# Patient Record
Sex: Male | Born: 1970
Health system: Southern US, Community
[De-identification: ages and names within clinical notes are randomized; demographics above are authoritative.]

## PROBLEM LIST (undated history)

## (undated) ENCOUNTER — Emergency Department (HOSPITAL_COMMUNITY): Payer: 59

## (undated) DIAGNOSIS — J45909 Unspecified asthma, uncomplicated: Secondary | ICD-10-CM

## (undated) DIAGNOSIS — F32A Depression, unspecified: Secondary | ICD-10-CM

## (undated) DIAGNOSIS — M1990 Unspecified osteoarthritis, unspecified site: Secondary | ICD-10-CM

## (undated) DIAGNOSIS — F329 Major depressive disorder, single episode, unspecified: Secondary | ICD-10-CM

## (undated) DIAGNOSIS — Z8619 Personal history of other infectious and parasitic diseases: Secondary | ICD-10-CM

## (undated) DIAGNOSIS — T7840XA Allergy, unspecified, initial encounter: Secondary | ICD-10-CM

## (undated) DIAGNOSIS — E119 Type 2 diabetes mellitus without complications: Secondary | ICD-10-CM

## (undated) HISTORY — DX: Unspecified osteoarthritis, unspecified site: M19.90

## (undated) HISTORY — DX: Major depressive disorder, single episode, unspecified: F32.9

## (undated) HISTORY — DX: Personal history of other infectious and parasitic diseases: Z86.19

## (undated) HISTORY — DX: Allergy, unspecified, initial encounter: T78.40XA

## (undated) HISTORY — DX: Type 2 diabetes mellitus without complications: E11.9

## (undated) HISTORY — DX: Unspecified asthma, uncomplicated: J45.909

## (undated) HISTORY — DX: Depression, unspecified: F32.A

---

## 2011-02-21 ENCOUNTER — Emergency Department (HOSPITAL_COMMUNITY)
Admission: EM | Admit: 2011-02-21 | Discharge: 2011-02-21 | Disposition: A | Discharge status: Home / Self Care | Payer: Self-pay | Attending: Emergency Medicine | Admitting: Emergency Medicine

## 2011-02-21 DIAGNOSIS — F17200 Nicotine dependence, unspecified, uncomplicated: Secondary | ICD-10-CM | POA: Insufficient documentation

## 2011-02-21 DIAGNOSIS — R0989 Other specified symptoms and signs involving the circulatory and respiratory systems: Secondary | ICD-10-CM | POA: Insufficient documentation

## 2011-02-21 DIAGNOSIS — R0609 Other forms of dyspnea: Secondary | ICD-10-CM | POA: Insufficient documentation

## 2015-08-09 ENCOUNTER — Ambulatory Visit (INDEPENDENT_AMBULATORY_CARE_PROVIDER_SITE_OTHER): Payer: 59 | Admitting: Internal Medicine

## 2015-08-09 ENCOUNTER — Encounter (INDEPENDENT_AMBULATORY_CARE_PROVIDER_SITE_OTHER): Payer: Self-pay

## 2015-08-09 ENCOUNTER — Encounter: Payer: Self-pay | Admitting: Internal Medicine

## 2015-08-09 VITALS — BP 110/80 | HR 66 | Temp 98.6°F | Resp 18 | Ht 72.0 in | Wt 260.4 lb

## 2015-08-09 DIAGNOSIS — Z23 Encounter for immunization: Secondary | ICD-10-CM

## 2015-08-09 DIAGNOSIS — Z125 Encounter for screening for malignant neoplasm of prostate: Secondary | ICD-10-CM | POA: Diagnosis not present

## 2015-08-09 DIAGNOSIS — M25521 Pain in right elbow: Secondary | ICD-10-CM | POA: Diagnosis not present

## 2015-08-09 DIAGNOSIS — M25512 Pain in left shoulder: Secondary | ICD-10-CM

## 2015-08-09 DIAGNOSIS — Z9109 Other allergy status, other than to drugs and biological substances: Secondary | ICD-10-CM

## 2015-08-09 DIAGNOSIS — E119 Type 2 diabetes mellitus without complications: Secondary | ICD-10-CM | POA: Diagnosis not present

## 2015-08-09 DIAGNOSIS — Z13220 Encounter for screening for lipoid disorders: Secondary | ICD-10-CM | POA: Diagnosis not present

## 2015-08-09 DIAGNOSIS — Z91048 Other nonmedicinal substance allergy status: Secondary | ICD-10-CM

## 2015-08-09 LAB — BASIC METABOLIC PANEL
BUN: 12 mg/dL (ref 7–25)
CHLORIDE: 103 mmol/L (ref 98–110)
CO2: 29 mmol/L (ref 20–31)
CREATININE: 1.16 mg/dL (ref 0.60–1.35)
Calcium: 9.4 mg/dL (ref 8.6–10.3)
Glucose, Bld: 118 mg/dL — ABNORMAL HIGH (ref 65–99)
Potassium: 3.9 mmol/L (ref 3.5–5.3)
Sodium: 140 mmol/L (ref 135–146)

## 2015-08-09 LAB — CBC WITH DIFFERENTIAL/PLATELET
BASOS ABS: 0 10*3/uL (ref 0.0–0.1)
BASOS PCT: 0 % (ref 0–1)
EOS ABS: 0.2 10*3/uL (ref 0.0–0.7)
EOS PCT: 3 % (ref 0–5)
HCT: 40.6 % (ref 39.0–52.0)
Hemoglobin: 14 g/dL (ref 13.0–17.0)
Lymphocytes Relative: 31 % (ref 12–46)
Lymphs Abs: 2.3 10*3/uL (ref 0.7–4.0)
MCH: 30.5 pg (ref 26.0–34.0)
MCHC: 34.5 g/dL (ref 30.0–36.0)
MCV: 88.5 fL (ref 78.0–100.0)
MPV: 10.4 fL (ref 8.6–12.4)
Monocytes Absolute: 0.5 10*3/uL (ref 0.1–1.0)
Monocytes Relative: 7 % (ref 3–12)
NEUTROS PCT: 59 % (ref 43–77)
Neutro Abs: 4.3 10*3/uL (ref 1.7–7.7)
PLATELETS: 186 10*3/uL (ref 150–400)
RBC: 4.59 MIL/uL (ref 4.22–5.81)
RDW: 13 % (ref 11.5–15.5)
WBC: 7.3 10*3/uL (ref 4.0–10.5)

## 2015-08-09 LAB — LIPID PANEL
CHOL/HDL RATIO: 4.4 ratio (ref <=5.0)
Cholesterol: 188 mg/dL (ref 125–200)
HDL: 43 mg/dL (ref >=40)
LDL Cholesterol: 117 mg/dL (ref <130)
Triglycerides: 139 mg/dL (ref <150)
VLDL: 28 mg/dL (ref <30)

## 2015-08-09 LAB — HEMOGLOBIN A1C
HEMOGLOBIN A1C: 7.8 % — AB (ref <5.7)
Mean Plasma Glucose: 177 mg/dL — ABNORMAL HIGH (ref <117)

## 2015-08-09 LAB — TSH: TSH: 0.887 u[IU]/mL (ref 0.350–4.500)

## 2015-08-09 LAB — HEPATIC FUNCTION PANEL
ALT: 19 U/L (ref 9–46)
AST: 14 U/L (ref 10–40)
Albumin: 4.4 g/dL (ref 3.6–5.1)
Alkaline Phosphatase: 65 U/L (ref 40–115)
BILIRUBIN DIRECT: 0.1 mg/dL (ref <=0.2)
BILIRUBIN TOTAL: 0.9 mg/dL (ref 0.2–1.2)
Indirect Bilirubin: 0.8 mg/dL (ref 0.2–1.2)
Total Protein: 7.2 g/dL (ref 6.1–8.1)

## 2015-08-09 NOTE — Patient Instructions (Signed)

## 2015-08-09 NOTE — Progress Notes (Signed)
Pre-visit discussion using our clinic review tool. No additional management support is needed unless otherwise documented below in the visit note.  

## 2015-08-09 NOTE — Progress Notes (Signed)
Patient ID: Edward Spencer, male   DOB: 09-03-1971, 44 y.o.   MRN: [id number]   Subjective:    Patient ID: Edward Spencer, male    DOB: 04-02-1971, 44 y.o.   MRN: [id number]  HPI  Patient here to establish care.  Has a history of diabetes and reports a history of asthma, arthritis, depression, allergies.  States was diagnosed with diabetes one year ago.  Was started on metformin.  Did not tolerate secondary to GI issues.  States sugars previously had been averaging 130-150.  Now sugars are increased to 200.  Discussed diet and exercise.  He has not seen a nutritionist.  Does report some right elbow pain.  States that has been present for a few months.  Takes alleve.  States was lifting and heard something pop.  Persistent pain.  Desires referral to ortho.  Has had problems with left shoulder pain for the last 8-10 years.  Sore when pressure is applied.  Intermittent flares.  Stable.  No nausea or vomiting.  No cardiac symptoms with increased activity or exertion.  No sob.  No urinary issues.  Bowels stable.     Past Medical History  Diagnosis Date  . Asthma   . Arthritis   . Depression   . Diabetes mellitus without complication   . History of chicken pox   . Allergy    No past surgical history on file. Family History  Problem Relation Age of Onset  . Arthritis Mother   . Hyperlipidemia Mother   . Arthritis Father   . Hypertension Father   . Diabetes Father   . Mental illness Maternal Aunt   . Hyperlipidemia Maternal Aunt   . Alcohol abuse Maternal Aunt   . Mental illness Paternal Aunt   . Alcohol abuse Paternal Aunt   . Arthritis Paternal Uncle   . Arthritis Maternal Grandmother   . Arthritis Paternal Grandmother   . Hyperlipidemia Paternal Grandmother   . Diabetes Paternal Grandmother    Social History   Social History  . Marital Status: Single    Spouse Name: N/A  . Number of Children: N/A  . Years of Education: N/A   Social History Main Topics  . Smoking status:  Current Every Day Smoker  . Smokeless tobacco: Never Used  . Alcohol Use: 0.0 oz/week    0 Standard drinks or equivalent per week  . Drug Use: No  . Sexual Activity: Not Asked   Other Topics Concern  . None   Social History Narrative    No outpatient encounter prescriptions on file as of 08/09/2015.   No facility-administered encounter medications on file as of 08/09/2015.    Review of Systems  Constitutional: Negative for fever and appetite change.  HENT: Negative for congestion and sinus pressure.   Eyes: Negative for pain and visual disturbance.  Respiratory: Negative for cough, chest tightness and shortness of breath.   Cardiovascular: Negative for chest pain, palpitations and leg swelling.  Gastrointestinal: Negative for nausea, vomiting, abdominal pain and diarrhea.  Genitourinary: Negative for dysuria and difficulty urinating.  Musculoskeletal: Negative for back pain and joint swelling.       Left shoulder pain as outlined.  Increased right elbow pain.    Skin: Negative for color change and rash.  Neurological: Negative for dizziness, light-headedness and headaches.  Hematological: Negative for adenopathy. Does not bruise/bleed easily.  Psychiatric/Behavioral: Negative for dysphoric mood and agitation.       Objective:    Physical Exam  Constitutional:  He appears well-developed and well-nourished. No distress.  HENT:  Nose: Nose normal.  Mouth/Throat: Oropharynx is clear and moist.  Eyes: Conjunctivae are normal. Right eye exhibits no discharge. Left eye exhibits no discharge.  Neck: Neck supple. No thyromegaly present.  Cardiovascular: Normal rate and regular rhythm.   Pulmonary/Chest: Effort normal and breath sounds normal. No respiratory distress.  Abdominal: Soft. Bowel sounds are normal. There is no tenderness.  Musculoskeletal: He exhibits no edema or tenderness.  Lymphadenopathy:    He has no cervical adenopathy.  Skin: No rash noted. No erythema.    Psychiatric: He has a normal mood and affect. His behavior is normal.    BP 110/80 mmHg  Pulse 66  Temp(Src) 98.6 F (37 C) (Oral)  Resp 18  Ht 6' (1.829 m)  Wt 260 lb 6 oz (118.105 kg)  BMI 35.31 kg/m2  SpO2 96% Wt Readings from Last 3 Encounters:  08/09/15 260 lb 6 oz (118.105 kg)       Assessment & Plan:   Problem List Items Addressed This Visit    Diabetes mellitus - Primary    Low carb diet and exercise.  On no medication now.  Discussed the possibility of trying extended release metformin.  May have less GI side effects.  Will check met b and a1c.  States sees eye doctor regularly.  Will refer to a nutritionist.        Relevant Orders   CBC with Differential/Platelet (Completed)   TSH (Completed)   Hepatic function panel (Completed)   Hemoglobin A1c (Completed)   Microalbumin / creatinine urine ratio (Completed)   Basic metabolic panel (Completed)   Ambulatory referral to Nutrition and Diabetic Education   Environmental allergies    History of allergies.  No problems reported.        Left shoulder pain    Persistent intermittent pain.  States has been present for years.  Desires no further intervention.  Follow.       Right elbow pain    States heard something "pop" when started.  Persistent pain.  Taking alleve.  Helps.  Continued pain.  Avoid positions that put his elbow in a strained position.  Have ortho evaluate.        Relevant Orders   Ambulatory referral to Orthopedic Surgery    Other Visit Diagnoses    Screening cholesterol level        Relevant Orders    Lipid panel (Completed)    Prostate cancer screening        Relevant Orders    PSA (Completed)        Einar Pheasant, MD

## 2015-08-10 ENCOUNTER — Encounter: Payer: Self-pay | Admitting: Internal Medicine

## 2015-08-10 LAB — MICROALBUMIN / CREATININE URINE RATIO
CREATININE, URINE: 278 mg/dL
MICROALB UR: 0.9 mg/dL (ref <2.0)
Microalb Creat Ratio: 3.2 mg/g (ref 0.0–30.0)

## 2015-08-10 LAB — PSA: PSA: 0.45 ng/mL (ref <=4.00)

## 2015-08-10 MED ORDER — METFORMIN HCL ER 500 MG PO TB24: 500.0000 mg | ORAL_TABLET | Freq: Every day | ORAL | Status: DC

## 2015-08-10 NOTE — Telephone Encounter (Signed)
rx sent in for metformin xr  q day #30 with 2 refills.  Pt notified via my chart.

## 2015-08-11 ENCOUNTER — Encounter: Payer: Self-pay | Admitting: Internal Medicine

## 2015-08-11 DIAGNOSIS — M25512 Pain in left shoulder: Secondary | ICD-10-CM | POA: Insufficient documentation

## 2015-08-11 DIAGNOSIS — Z9109 Other allergy status, other than to drugs and biological substances: Secondary | ICD-10-CM | POA: Insufficient documentation

## 2015-08-11 DIAGNOSIS — M25521 Pain in right elbow: Secondary | ICD-10-CM | POA: Insufficient documentation

## 2015-08-11 NOTE — Assessment & Plan Note (Signed)
States heard something "pop" when started.  Persistent pain.  Taking alleve.  Helps.  Continued pain.  Avoid positions that put his elbow in a strained position.  Have ortho evaluate.

## 2015-08-11 NOTE — Assessment & Plan Note (Signed)
History of allergies.  No problems reported.

## 2015-08-11 NOTE — Assessment & Plan Note (Signed)
Persistent intermittent pain.  States has been present for years.  Desires no further intervention.  Follow.

## 2015-08-11 NOTE — Assessment & Plan Note (Addendum)
Low carb diet and exercise.  On no medication now.  Discussed the possibility of trying extended release metformin.  May have less GI side effects.  Will check met b and a1c.  States sees eye doctor regularly.  Will refer to a nutritionist.

## 2015-08-12 ENCOUNTER — Encounter: Payer: Self-pay | Admitting: Internal Medicine

## 2015-08-19 DIAGNOSIS — M7710 Lateral epicondylitis, unspecified elbow: Secondary | ICD-10-CM | POA: Insufficient documentation

## 2015-09-24 ENCOUNTER — Ambulatory Visit (INDEPENDENT_AMBULATORY_CARE_PROVIDER_SITE_OTHER): Payer: 59 | Admitting: Internal Medicine

## 2015-09-24 ENCOUNTER — Encounter: Payer: Self-pay | Admitting: Internal Medicine

## 2015-09-24 VITALS — BP 122/80 | HR 66 | Temp 98.7°F | Resp 18 | Ht 72.0 in | Wt 257.4 lb

## 2015-09-24 DIAGNOSIS — M25512 Pain in left shoulder: Secondary | ICD-10-CM | POA: Diagnosis not present

## 2015-09-24 DIAGNOSIS — E119 Type 2 diabetes mellitus without complications: Secondary | ICD-10-CM | POA: Diagnosis not present

## 2015-09-24 DIAGNOSIS — Z91048 Other nonmedicinal substance allergy status: Secondary | ICD-10-CM

## 2015-09-24 DIAGNOSIS — Z0000 Encounter for general adult medical examination without abnormal findings: Secondary | ICD-10-CM

## 2015-09-24 DIAGNOSIS — Z9109 Other allergy status, other than to drugs and biological substances: Secondary | ICD-10-CM

## 2015-09-24 DIAGNOSIS — M25521 Pain in right elbow: Secondary | ICD-10-CM

## 2015-09-24 NOTE — Progress Notes (Signed)
Patient ID: Edward Spencer, male   DOB: 06/27/1971, 44 y.o.   MRN: [id number]   Subjective:    Patient ID: Edward Spencer, male    DOB: 16-Mar-1971, 44 y.o.   MRN: [id number]  HPI  Patient with past history of diabetes and documented asthma who comes in today to follow up on these issues as well as for a complete physical exam.  He declined physical today.  States his sugar is averaging 140-190.  AM sugars averaging 150.  He is tolerating the extended release metformin.  Discussed diet and exercise.  He checks his sugars about 30 minutes after eating.  Discussed importance of eating regular meals.  No cardiac symptoms with increased activity or exertion.  No sob.  No increased cough or congestion.  No acid reflux.  No abdominal pain or cramping.  Bowels stable.  Discussed referral to lifestyles.     Past Medical History  Diagnosis Date  . Asthma   . Arthritis   . Depression   . Diabetes mellitus without complication ([place])   . History of chicken pox   . Allergy    No past surgical history on file. Family History  Problem Relation Age of Onset  . Arthritis Mother   . Hyperlipidemia Mother   . Arthritis Father   . Hypertension Father   . Diabetes Father   . Mental illness Maternal Aunt   . Hyperlipidemia Maternal Aunt   . Alcohol abuse Maternal Aunt   . Mental illness Paternal Aunt   . Alcohol abuse Paternal Aunt   . Arthritis Paternal Uncle   . Arthritis Maternal Grandmother   . Arthritis Paternal Grandmother   . Hyperlipidemia Paternal Grandmother   . Diabetes Paternal Grandmother    Social History   Social History  . Marital Status: Single    Spouse Name: N/A  . Number of Children: N/A  . Years of Education: N/A   Social History Main Topics  . Smoking status: Current Every Day Smoker  . Smokeless tobacco: Never Used  . Alcohol Use: 0.0 oz/week    0 Standard drinks or equivalent per week  . Drug Use: No  . Sexual Activity: Not Asked   Other Topics Concern  . None    Social History Narrative    Outpatient Encounter Prescriptions as of 09/24/2015  Medication Sig  . meloxicam (MOBIC) 15 MG tablet Take 15 mg by mouth daily.  . metFORMIN (GLUCOPHAGE XR) 500 MG 24 hr tablet Take 1 tablet (500 mg total) by mouth daily with breakfast.   No facility-administered encounter medications on file as of 09/24/2015.    Review of Systems  Constitutional: Negative for appetite change and unexpected weight change.  HENT: Negative for congestion and sinus pressure.   Eyes: Negative for pain and visual disturbance.  Respiratory: Negative for cough, chest tightness and shortness of breath.   Cardiovascular: Negative for chest pain, palpitations and leg swelling.  Gastrointestinal: Negative for nausea, vomiting, abdominal pain and diarrhea.  Genitourinary: Negative for dysuria and difficulty urinating.  Musculoskeletal: Negative for back pain and joint swelling.  Skin: Negative for color change and rash.  Neurological: Negative for dizziness, light-headedness and headaches.  Hematological: Negative for adenopathy. Does not bruise/bleed easily.  Psychiatric/Behavioral: Negative for dysphoric mood and agitation.       Objective:     Blood pressure rechecked by me:  118/82  Physical Exam  Constitutional: He is oriented to person, place, and time. He appears well-developed and well-nourished. No distress.  HENT:  Head: Normocephalic and atraumatic.  Nose: Nose normal.  Mouth/Throat: Oropharynx is clear and moist.  Eyes: Conjunctivae are normal. Right eye exhibits no discharge. Left eye exhibits no discharge.  Neck: Neck supple. No thyromegaly present.  Cardiovascular: Normal rate and regular rhythm.   Pulmonary/Chest: Breath sounds normal. No respiratory distress. He has no wheezes.  Abdominal: Soft. Bowel sounds are normal. There is no tenderness.  Genitourinary:  Pt declined.    Musculoskeletal: He exhibits no edema or tenderness.  Lymphadenopathy:    He  has no cervical adenopathy.  Neurological: He is alert and oriented to person, place, and time.  Skin: Skin is warm and dry. No rash noted. No erythema.  Psychiatric: He has a normal mood and affect. His behavior is normal.    BP 122/80 mmHg  Pulse 66  Temp(Src) 98.7 F (37.1 C) (Oral)  Resp 18  Ht 6' (1.829 m)  Wt 257 lb 6 oz (116.745 kg)  BMI 34.90 kg/m2  SpO2 97% Wt Readings from Last 3 Encounters:  09/24/15 257 lb 6 oz (116.745 kg)  08/09/15 260 lb 6 oz (118.105 kg)     Lab Results  Component Value Date   WBC 7.3 08/09/2015   HGB 14.0 08/09/2015   HCT 40.6 08/09/2015   PLT 186 08/09/2015   GLUCOSE 118* 08/09/2015   CHOL 188 08/09/2015   TRIG 139 08/09/2015   HDL 43 08/09/2015   LDLCALC 117 08/09/2015   ALT 19 08/09/2015   AST 14 08/09/2015   NA 140 08/09/2015   K 3.9 08/09/2015   CL 103 08/09/2015   CREATININE 1.16 08/09/2015   BUN 12 08/09/2015   CO2 29 08/09/2015   TSH 0.887 08/09/2015   PSA 0.45 08/09/2015   HGBA1C 7.8* 08/09/2015   MICROALBUR 0.9 08/09/2015       Assessment & Plan:   Problem List Items Addressed This Visit    Diabetes mellitus ([place]) - Primary    Tolerating the extended release metformin.  Discussed eating regular meals and diet adjustment.  Discussed checking sugars fasting and two hours after eating.  Continue same medication regimen.  Refer to lifestyles. Follow sugars.  Follow met b and a1c.        Relevant Orders   Lipid panel   Hepatic function panel   Hemoglobin W6O   Basic metabolic panel   Environmental allergies    History of allergies.  No problems reported today.  Follow.        Health care maintenance    Declined complete physical today.  PSA .45 08/09/15.  Follow cholesterol.        Left shoulder pain    Present for years.  Follow.       Right elbow pain    Was referred to ortho.            Einar Pheasant, MD

## 2015-09-24 NOTE — Progress Notes (Signed)
Pre-visit discussion using our clinic review tool. No additional management support is needed unless otherwise documented below in the visit note.  

## 2015-09-29 ENCOUNTER — Encounter: Payer: Self-pay | Admitting: Internal Medicine

## 2015-09-29 DIAGNOSIS — Z0000 Encounter for general adult medical examination without abnormal findings: Secondary | ICD-10-CM | POA: Insufficient documentation

## 2015-09-29 NOTE — Assessment & Plan Note (Signed)
Tolerating the extended release metformin.  Discussed eating regular meals and diet adjustment.  Discussed checking sugars fasting and two hours after eating.  Continue same medication regimen.  Refer to lifestyles. Follow sugars.  Follow met b and a1c.

## 2015-09-29 NOTE — Assessment & Plan Note (Signed)
Present for years.  Follow.

## 2015-09-29 NOTE — Assessment & Plan Note (Signed)
Declined complete physical today.  PSA .45 08/09/15.  Follow cholesterol.

## 2015-09-29 NOTE — Assessment & Plan Note (Signed)
History of allergies.  No problems reported today.  Follow.

## 2015-09-29 NOTE — Assessment & Plan Note (Signed)
Was referred to ortho.

## 2015-10-16 ENCOUNTER — Other Ambulatory Visit: Payer: Self-pay | Admitting: Internal Medicine

## 2015-11-07 ENCOUNTER — Other Ambulatory Visit: Payer: Self-pay | Admitting: Internal Medicine

## 2015-12-03 ENCOUNTER — Other Ambulatory Visit: Payer: 59

## 2015-12-06 ENCOUNTER — Ambulatory Visit: Payer: 59 | Admitting: Internal Medicine

## 2016-01-24 ENCOUNTER — Other Ambulatory Visit (INDEPENDENT_AMBULATORY_CARE_PROVIDER_SITE_OTHER): Payer: 59

## 2016-01-24 DIAGNOSIS — E119 Type 2 diabetes mellitus without complications: Secondary | ICD-10-CM | POA: Diagnosis not present

## 2016-01-24 LAB — BASIC METABOLIC PANEL
BUN: 19 mg/dL (ref 6–23)
CALCIUM: 9.5 mg/dL (ref 8.4–10.5)
CO2: 30 meq/L (ref 19–32)
Chloride: 105 mEq/L (ref 96–112)
Creatinine, Ser: 1.13 mg/dL (ref 0.40–1.50)
GFR: 74.25 mL/min (ref >60.00)
Glucose, Bld: 117 mg/dL — ABNORMAL HIGH (ref 70–99)
POTASSIUM: 4.5 meq/L (ref 3.5–5.1)
SODIUM: 140 meq/L (ref 135–145)

## 2016-01-24 LAB — LIPID PANEL
CHOL/HDL RATIO: 4
CHOLESTEROL: 147 mg/dL (ref 0–200)
HDL: 34.7 mg/dL — ABNORMAL LOW (ref >39.00)
LDL CALC: 88 mg/dL (ref 0–99)
NonHDL: 112.77
Triglycerides: 122 mg/dL (ref 0.0–149.0)
VLDL: 24.4 mg/dL (ref 0.0–40.0)

## 2016-01-24 LAB — HEPATIC FUNCTION PANEL
ALT: 17 U/L (ref 0–53)
AST: 16 U/L (ref 0–37)
Albumin: 4.6 g/dL (ref 3.5–5.2)
Alkaline Phosphatase: 50 U/L (ref 39–117)
BILIRUBIN TOTAL: 0.7 mg/dL (ref 0.2–1.2)
Bilirubin, Direct: 0.2 mg/dL (ref 0.0–0.3)
Total Protein: 7.5 g/dL (ref 6.0–8.3)

## 2016-01-24 LAB — HEMOGLOBIN A1C: HEMOGLOBIN A1C: 7.6 % — AB (ref 4.6–6.5)

## 2016-01-25 ENCOUNTER — Encounter: Payer: Self-pay | Admitting: Internal Medicine

## 2016-01-31 ENCOUNTER — Encounter: Payer: Self-pay | Admitting: Internal Medicine

## 2016-01-31 ENCOUNTER — Ambulatory Visit (INDEPENDENT_AMBULATORY_CARE_PROVIDER_SITE_OTHER): Payer: 59 | Admitting: Internal Medicine

## 2016-01-31 VITALS — BP 116/60 | HR 82 | Temp 97.4°F | Resp 12 | Ht 72.0 in | Wt 247.8 lb

## 2016-01-31 DIAGNOSIS — R51 Headache: Secondary | ICD-10-CM

## 2016-01-31 DIAGNOSIS — R519 Headache, unspecified: Secondary | ICD-10-CM

## 2016-01-31 DIAGNOSIS — E119 Type 2 diabetes mellitus without complications: Secondary | ICD-10-CM | POA: Diagnosis not present

## 2016-01-31 DIAGNOSIS — M25521 Pain in right elbow: Secondary | ICD-10-CM | POA: Diagnosis not present

## 2016-01-31 LAB — SEDIMENTATION RATE: Sed Rate: 10 mm/hr (ref 0–22)

## 2016-01-31 NOTE — Progress Notes (Signed)
Patient ID: GOERGE Spencer, male   DOB: [birth year], 45 y.o.   MRN: [id number]   Subjective:    Patient ID: Edward Spencer, male    DOB: 12-Sep-1971, 45 y.o.   MRN: [id number]  HPI  Patient here for a scheduled follow up.  Has diabetes.  On metformin XR.  Over the last few weeks, he has really adjusted his diet.  Lost weight.  Very strict with his diet.  Virtually no carbs.  States sugars now 104 - 117 in the am and pm sugars 80-120.  Does not eat a bedtime snack.  No chest pain or tightness.  Stays active.  No sob.  No acid reflux.  No abdominal pain or cramping.  Bowels stable.  Reports persistent headache for the last 10 days.  Does not usually have headaches.  States usually starts around 9:00- lunch.  Then lasts all day.  States eating lunch may help some, but the headache does not resolve until goes to bed.  Does not wake up with headache.  Takes alleve.  Describes the headache - involves frontal pressure - around and behind eyes.  No congestion.  Some eye strain.  No dizziness or light headedness.      Past Medical History  Diagnosis Date  . Asthma   . Arthritis   . Depression   . Diabetes mellitus without complication ([place])   . History of chicken pox   . Allergy    No past surgical history on file. Family History  Problem Relation Age of Onset  . Arthritis Mother   . Hyperlipidemia Mother   . Arthritis Father   . Hypertension Father   . Diabetes Father   . Mental illness Maternal Aunt   . Hyperlipidemia Maternal Aunt   . Alcohol abuse Maternal Aunt   . Mental illness Paternal Aunt   . Alcohol abuse Paternal Aunt   . Arthritis Paternal Uncle   . Arthritis Maternal Grandmother   . Arthritis Paternal Grandmother   . Hyperlipidemia Paternal Grandmother   . Diabetes Paternal Grandmother    Social History   Social History  . Marital Status: Single    Spouse Name: N/A  . Number of Children: N/A  . Years of Education: N/A   Social History Main Topics  . Smoking status:  Current Every Day Smoker  . Smokeless tobacco: Never Used  . Alcohol Use: 0.0 oz/week    0 Standard drinks or equivalent per week  . Drug Use: No  . Sexual Activity: Not Asked   Other Topics Concern  . None   Social History Narrative    Outpatient Encounter Prescriptions as of 01/31/2016  Medication Sig  . metFORMIN (GLUCOPHAGE-XR) 500 MG 24 hr tablet TAKE 1 TABLET (500 MG TOTAL) BY MOUTH DAILY WITH BREAKFAST.  . [DISCONTINUED] meloxicam (MOBIC) 15 MG tablet Take 15 mg by mouth daily.  . [DISCONTINUED] metFORMIN (GLUCOPHAGE-XR) 500 MG 24 hr tablet TAKE 1 TABLET (500 MG TOTAL) BY MOUTH DAILY WITH BREAKFAST.   No facility-administered encounter medications on file as of 01/31/2016.    Review of Systems  Constitutional:       Has adjusted his diet.  Lost weight.  Low carbs.    HENT: Negative for congestion and sinus pressure.   Eyes: Negative for pain and discharge.  Respiratory: Negative for cough, chest tightness and shortness of breath.   Cardiovascular: Negative for chest pain, palpitations and leg swelling.  Gastrointestinal: Negative for nausea, vomiting, abdominal pain and diarrhea.  Genitourinary:  Negative for dysuria and difficulty urinating.  Musculoskeletal: Negative for back pain and joint swelling.  Skin: Negative for color change and rash.  Neurological: Positive for headaches. Negative for dizziness and light-headedness.  Psychiatric/Behavioral: Negative for dysphoric mood and agitation.       Objective:    Physical Exam  Constitutional: He appears well-developed and well-nourished. No distress.  HENT:  Nose: Nose normal.  Mouth/Throat: Oropharynx is clear and moist.  TMs without erythema.   Eyes: Conjunctivae are normal. Right eye exhibits no discharge. Left eye exhibits no discharge.  Neck: Neck supple. No thyromegaly present.  Cardiovascular: Normal rate and regular rhythm.   Pulmonary/Chest: Effort normal and breath sounds normal. No respiratory  distress.  Abdominal: Soft. Bowel sounds are normal. There is no tenderness.  Musculoskeletal: He exhibits no edema or tenderness.  Lymphadenopathy:    He has no cervical adenopathy.  Skin: No rash noted. No erythema.  Psychiatric: He has a normal mood and affect. His behavior is normal.    BP 116/60 mmHg  Pulse 82  Temp(Src) 97.4 F (36.3 C) (Oral)  Resp 12  Ht 6' (1.829 m)  Wt 247 lb 12.8 oz (112.401 kg)  BMI 33.60 kg/m2  SpO2 97% Wt Readings from Last 3 Encounters:  01/31/16 247 lb 12.8 oz (112.401 kg)  09/24/15 257 lb 6 oz (116.745 kg)  08/09/15 260 lb 6 oz (118.105 kg)     Lab Results  Component Value Date   WBC 7.3 08/09/2015   HGB 14.0 08/09/2015   HCT 40.6 08/09/2015   PLT 186 08/09/2015   GLUCOSE 117* 01/24/2016   CHOL 147 01/24/2016   TRIG 122.0 01/24/2016   HDL 34.70* 01/24/2016   LDLCALC 88 01/24/2016   ALT 17 01/24/2016   AST 16 01/24/2016   NA 140 01/24/2016   K 4.5 01/24/2016   CL 105 01/24/2016   CREATININE 1.13 01/24/2016   BUN 19 01/24/2016   CO2 30 01/24/2016   TSH 0.887 08/09/2015   PSA 0.45 08/09/2015   HGBA1C 7.6* 01/24/2016   MICROALBUR 0.9 08/09/2015       Assessment & Plan:   Problem List Items Addressed This Visit    Diabetes mellitus ([place])    A1c recently checked 7.6.  Decreased slightly.  He has adjusted his diet significantly in the last few weeks.  Sugars as outlined.  Hold on increasing medication.  Discussed the need to eat regular meals and snacks and needs to eat a bedtime snack.  Concern may be a little too strict with diet, contributing to headaches.  Discussed liberalizing diet some.  Follow sugars.  Get him back in soon to reassess.        Headache - Primary    Persistent headache over the last 10 days.  Unclear etiology.  No sinus symptoms.  Sleeping well.  Some eye strain.  Schedule appt for eye exam. Liberalize diet as discussed.  Check ESR.  No neck issues.  Discussed further w/up if persistent (including scanning,  etc).  Will try above measures first.  Follow closely.  Pt in agreement.        Relevant Orders   Sedimentation rate (Completed)   Right elbow pain    Saw ortho.  Took mobic.  Better.  Follow.          I spent 25 minutes with the patient and more than 50% of the time was spent in consultation regarding the above.     Einar Pheasant, MD

## 2016-01-31 NOTE — Progress Notes (Signed)
Pre visit review using our clinic review tool, if applicable. No additional management support is needed unless otherwise documented below in the visit note. 

## 2016-02-01 ENCOUNTER — Encounter: Payer: Self-pay | Admitting: Internal Medicine

## 2016-02-02 ENCOUNTER — Encounter: Payer: Self-pay | Admitting: Internal Medicine

## 2016-02-02 DIAGNOSIS — R519 Headache, unspecified: Secondary | ICD-10-CM | POA: Insufficient documentation

## 2016-02-02 DIAGNOSIS — R51 Headache: Secondary | ICD-10-CM

## 2016-02-02 NOTE — Assessment & Plan Note (Signed)
Saw ortho.  Took mobic.  Better.  Follow.

## 2016-02-02 NOTE — Assessment & Plan Note (Signed)
Persistent headache over the last 10 days.  Unclear etiology.  No sinus symptoms.  Sleeping well.  Some eye strain.  Schedule appt for eye exam. Liberalize diet as discussed.  Check ESR.  No neck issues.  Discussed further w/up if persistent (including scanning, etc).  Will try above measures first.  Follow closely.  Pt in agreement.

## 2016-02-02 NOTE — Assessment & Plan Note (Signed)
A1c recently checked 7.6.  Decreased slightly.  He has adjusted his diet significantly in the last few weeks.  Sugars as outlined.  Hold on increasing medication.  Discussed the need to eat regular meals and snacks and needs to eat a bedtime snack.  Concern may be a little too strict with diet, contributing to headaches.  Discussed liberalizing diet some.  Follow sugars.  Get him back in soon to reassess.

## 2016-02-05 NOTE — Telephone Encounter (Signed)
Unread mychart message mailed to patient 

## 2016-02-21 ENCOUNTER — Ambulatory Visit (INDEPENDENT_AMBULATORY_CARE_PROVIDER_SITE_OTHER): Payer: 59 | Admitting: Internal Medicine

## 2016-02-21 ENCOUNTER — Encounter: Payer: Self-pay | Admitting: Internal Medicine

## 2016-02-21 VITALS — BP 110/62 | HR 68 | Temp 98.6°F | Resp 18 | Ht 72.0 in | Wt 246.2 lb

## 2016-02-21 DIAGNOSIS — R51 Headache: Secondary | ICD-10-CM | POA: Diagnosis not present

## 2016-02-21 DIAGNOSIS — Z91048 Other nonmedicinal substance allergy status: Secondary | ICD-10-CM | POA: Diagnosis not present

## 2016-02-21 DIAGNOSIS — Z9109 Other allergy status, other than to drugs and biological substances: Secondary | ICD-10-CM

## 2016-02-21 DIAGNOSIS — R519 Headache, unspecified: Secondary | ICD-10-CM

## 2016-02-21 DIAGNOSIS — E119 Type 2 diabetes mellitus without complications: Secondary | ICD-10-CM

## 2016-02-21 MED ORDER — METFORMIN HCL ER 500 MG PO TB24: ORAL_TABLET | ORAL | Status: DC

## 2016-02-21 NOTE — Progress Notes (Signed)
Pre-visit discussion using our clinic review tool. No additional management support is needed unless otherwise documented below in the visit note.  

## 2016-02-21 NOTE — Progress Notes (Signed)
Patient ID: Edward Spencer, male   DOB: 1971/04/25, 45 y.o.   MRN: [id number]   Subjective:    Patient ID: Edward Spencer, male    DOB: Mar 08, 1971, 45 y.o.   MRN: [id number]  HPI  Patient here for a scheduled follow up.  He feels his headaches were coming from allergies and sinus.  No headache now.  Has liberalized his diet some.  Still monitoring carbs.  He is watching his diet.  Has lost weight.  No chest pain or tightness.  No sob.  No acid reflux.  No abdominal pain or cramping.  Bowels stable.  States blood sugar in am and pm 120s.  Just after eat - 150-160.  Feels good.     Past Medical History  Diagnosis Date  . Asthma   . Arthritis   . Depression   . Diabetes mellitus without complication ([place])   . History of chicken pox   . Allergy    No past surgical history on file. Family History  Problem Relation Age of Onset  . Arthritis Mother   . Hyperlipidemia Mother   . Arthritis Father   . Hypertension Father   . Diabetes Father   . Mental illness Maternal Aunt   . Hyperlipidemia Maternal Aunt   . Alcohol abuse Maternal Aunt   . Mental illness Paternal Aunt   . Alcohol abuse Paternal Aunt   . Arthritis Paternal Uncle   . Arthritis Maternal Grandmother   . Arthritis Paternal Grandmother   . Hyperlipidemia Paternal Grandmother   . Diabetes Paternal Grandmother    Social History   Social History  . Marital Status: Single    Spouse Name: N/A  . Number of Children: N/A  . Years of Education: N/A   Social History Main Topics  . Smoking status: Current Every Day Smoker  . Smokeless tobacco: Never Used  . Alcohol Use: 0.0 oz/week    0 Standard drinks or equivalent per week  . Drug Use: No  . Sexual Activity: Not Asked   Other Topics Concern  . None   Social History Narrative    Outpatient Encounter Prescriptions as of 02/21/2016  Medication Sig  . metFORMIN (GLUCOPHAGE-XR) 500 MG 24 hr tablet TAKE 1 TABLET (500 MG TOTAL) BY MOUTH DAILY WITH BREAKFAST.  .  [DISCONTINUED] metFORMIN (GLUCOPHAGE-XR) 500 MG 24 hr tablet TAKE 1 TABLET (500 MG TOTAL) BY MOUTH DAILY WITH BREAKFAST.  Edward Kitchen phentermine (ADIPEX-P) 37.5 MG tablet Reported on 02/21/2016   No facility-administered encounter medications on file as of 02/21/2016.    Review of Systems  Constitutional: Negative for unexpected weight change.       Has adjusted diet.  Has lost weight.   HENT: Negative for congestion and sinus pressure.   Respiratory: Negative for cough, chest tightness and shortness of breath.   Cardiovascular: Negative for chest pain, palpitations and leg swelling.  Gastrointestinal: Negative for nausea, vomiting, abdominal pain and diarrhea.  Skin: Negative for color change and rash.  Neurological: Negative for dizziness, light-headedness and headaches.  Psychiatric/Behavioral: Negative for dysphoric mood and agitation.       Objective:    Physical Exam  Constitutional: He appears well-developed and well-nourished. No distress.  HENT:  Nose: Nose normal.  Mouth/Throat: Oropharynx is clear and moist.  Neck: Neck supple. No thyromegaly present.  Cardiovascular: Normal rate and regular rhythm.   Pulmonary/Chest: Effort normal and breath sounds normal. No respiratory distress.  Abdominal: Soft. Bowel sounds are normal. There is no tenderness.  Musculoskeletal: He exhibits no edema or tenderness.  Lymphadenopathy:    He has no cervical adenopathy.  Skin: No rash noted. No erythema.  Psychiatric: He has a normal mood and affect. His behavior is normal.    BP 110/62 mmHg  Pulse 68  Temp(Src) 98.6 F (37 C) (Oral)  Resp 18  Ht 6' (1.829 m)  Wt 246 lb 4 oz (111.698 kg)  BMI 33.39 kg/m2  SpO2 95% Wt Readings from Last 3 Encounters:  02/21/16 246 lb 4 oz (111.698 kg)  01/31/16 247 lb 12.8 oz (112.401 kg)  09/24/15 257 lb 6 oz (116.745 kg)     Lab Results  Component Value Date   WBC 7.3 08/09/2015   HGB 14.0 08/09/2015   HCT 40.6 08/09/2015   PLT 186 08/09/2015    GLUCOSE 117* 01/24/2016   CHOL 147 01/24/2016   TRIG 122.0 01/24/2016   HDL 34.70* 01/24/2016   LDLCALC 88 01/24/2016   ALT 17 01/24/2016   AST 16 01/24/2016   NA 140 01/24/2016   K 4.5 01/24/2016   CL 105 01/24/2016   CREATININE 1.13 01/24/2016   BUN 19 01/24/2016   CO2 30 01/24/2016   TSH 0.887 08/09/2015   PSA 0.45 08/09/2015   HGBA1C 7.6* 01/24/2016   MICROALBUR 0.9 08/09/2015       Assessment & Plan:   Problem List Items Addressed This Visit    Diabetes mellitus ([place]) - Primary    Sugars as outlined.  Tolerating metformin XR.  Watching diet.  Follow met b and a1c.       Relevant Medications   metFORMIN (GLUCOPHAGE-XR) 500 MG 24 hr tablet   Other Relevant Orders   Lipid panel   Hepatic function panel   Hemoglobin K3E   Basic metabolic panel   Environmental allergies    Controlled.        Headache    Better.  Resolved.  Felt related to allergies/sinus.  Follow.           Einar Pheasant, MD

## 2016-02-23 ENCOUNTER — Encounter: Payer: Self-pay | Admitting: Internal Medicine

## 2016-02-23 NOTE — Assessment & Plan Note (Signed)
Better.  Resolved.  Felt related to allergies/sinus.  Follow.

## 2016-02-23 NOTE — Assessment & Plan Note (Signed)
Controlled.  

## 2016-02-23 NOTE — Assessment & Plan Note (Signed)
Sugars as outlined.  Tolerating metformin XR.  Watching diet.  Follow met b and a1c.

## 2016-04-26 ENCOUNTER — Encounter: Payer: Self-pay | Admitting: Internal Medicine

## 2016-04-27 ENCOUNTER — Encounter: Payer: Self-pay | Admitting: Emergency Medicine

## 2016-04-27 ENCOUNTER — Emergency Department: Payer: Managed Care, Other (non HMO)

## 2016-04-27 ENCOUNTER — Ambulatory Visit
Admission: EM | Admit: 2016-04-27 | Discharge: 2016-04-27 | Disposition: A | Discharge status: Home / Self Care | Payer: Managed Care, Other (non HMO) | Attending: Family Medicine | Admitting: Family Medicine

## 2016-04-27 DIAGNOSIS — E119 Type 2 diabetes mellitus without complications: Secondary | ICD-10-CM | POA: Insufficient documentation

## 2016-04-27 DIAGNOSIS — R519 Headache, unspecified: Secondary | ICD-10-CM

## 2016-04-27 DIAGNOSIS — M1990 Unspecified osteoarthritis, unspecified site: Secondary | ICD-10-CM | POA: Diagnosis not present

## 2016-04-27 DIAGNOSIS — F17210 Nicotine dependence, cigarettes, uncomplicated: Secondary | ICD-10-CM | POA: Diagnosis not present

## 2016-04-27 DIAGNOSIS — J45909 Unspecified asthma, uncomplicated: Secondary | ICD-10-CM | POA: Insufficient documentation

## 2016-04-27 DIAGNOSIS — F329 Major depressive disorder, single episode, unspecified: Secondary | ICD-10-CM | POA: Diagnosis not present

## 2016-04-27 DIAGNOSIS — R51 Headache: Secondary | ICD-10-CM | POA: Diagnosis not present

## 2016-04-27 DIAGNOSIS — T700XXA Otitic barotrauma, initial encounter: Secondary | ICD-10-CM | POA: Insufficient documentation

## 2016-04-27 NOTE — ED Notes (Addendum)
Pt presents to ed c/o severe headache since Friday with dizziness. Was sent here by [facility] for [facility]

## 2016-04-27 NOTE — ED Provider Notes (Signed)
CSN: [id number]     Arrival date & time 04/27/16  1733 History   First MD Initiated Contact with Patient 04/27/16 1841    Nurses notes were reviewed. Chief Complaint  Patient presents with  . Headache    Pt reports frontal headache behind eyes, nausea, and weakness since Friday. Seen at [facility] on Saturday and diagnosed with sinus infection and placed on Augmentin without improvement. Change in positions make nausea worse. Pain 10/10   Patient is reporting a frontal headache. This headache started on Friday with generalized weakness. [facility] and diagnosed with a sinus infection and placed on Augmentin. He and his significant other reports that he doesn't really have postnasal drainage and the Augmentin has not helped him at all. Change in position makes him worse he states the pain is a 10 out of 10 and reports pain is in the back of his eyes and his eyes just throbbing. He's never had headaches significantly before and this headache is definitely a 10 out of 10 and no history of migraine problem. There is no history of migraines headaches in the family.  He has a history of diabetes depression and asthma and arthritis and his brothers had arthritis and hyperlipidemia as follows had hypertension arthritis and diabetes. Unfortunately. He does smoke.    (Consider location/radiation/quality/duration/timing/severity/associated sxs/prior Treatment) Patient is a 45 y.o. male presenting with headaches. The history is provided by the patient and a significant other. No language interpreter was used.  Headache Pain location:  Frontal Quality:  Sharp Severity at highest:  10/10 Onset quality:  Sudden Duration:  4 days Timing:  Constant Progression:  Waxing and waning Chronicity:  New Similar to prior headaches: no   Context: activity   Relieved by:  Nothing Worsened by:  Nothing Ineffective treatments:  None tried (Augmentin has failed) Associated symptoms:  eye pain, facial pain, nausea and weakness   Risk factors: sedentary lifestyle     Past Medical History  Diagnosis Date  . Asthma   . Arthritis   . Depression   . Diabetes mellitus without complication ([facility])   . History of chicken pox   . Allergy    History reviewed. No pertinent past surgical history. Family History  Problem Relation Age of Onset  . Arthritis Mother   . Hyperlipidemia Mother   . Arthritis Father   . Hypertension Father   . Diabetes Father   . Mental illness Maternal Aunt   . Hyperlipidemia Maternal Aunt   . Alcohol abuse Maternal Aunt   . Mental illness Paternal Aunt   . Alcohol abuse Paternal Aunt   . Arthritis Paternal Uncle   . Arthritis Maternal Grandmother   . Arthritis Paternal Grandmother   . Hyperlipidemia Paternal Grandmother   . Diabetes Paternal Grandmother    Social History  Substance Use Topics  . Smoking status: Current Every Day Smoker    Types: Cigarettes  . Smokeless tobacco: Never Used  . Alcohol Use: 0.0 oz/week    0 Standard drinks or equivalent per week     Comment: social    Review of Systems  Eyes: Positive for pain.  Gastrointestinal: Positive for nausea.  Neurological: Positive for weakness and headaches.  All other systems reviewed and are negative.   Allergies  Review of patient's allergies indicates no known allergies.  Home Medications   Prior to Admission medications   Medication Sig Start Date End Date Taking? Authorizing Provider  metFORMIN (GLUCOPHAGE-XR) 500 MG  24 hr tablet TAKE 1 TABLET (500 MG TOTAL) BY MOUTH DAILY WITH BREAKFAST. 02/21/16   Dale Durhamharlene Scott, MD  phentermine (ADIPEX-P) 37.5 MG tablet Reported on 02/21/2016 02/10/16   Historical Provider, MD   Meds Ordered and Administered this Visit  Medications - No data to display  BP 120/72 mmHg  Pulse 56  Temp(Src) 98.6 F (37 C) (Oral)  Resp 20  Ht 6\' 1"  (1.854 m)  Wt 224 lb (101.606 kg)  BMI 29.56 kg/m2  SpO2 97% No data found.   Physical  Exam  Constitutional: He is oriented to person, place, and time. He appears well-developed and well-nourished.  HENT:  Head: Normocephalic and atraumatic.    Right Ear: Hearing, tympanic membrane, external ear and ear canal normal.  Left Ear: Hearing, tympanic membrane and external ear normal.  Nose: Nose normal.  Mouth/Throat: Uvula is midline and oropharynx is clear and moist. No oral lesions. Normal dentition. No dental caries. No posterior oropharyngeal edema or posterior oropharyngeal erythema.  He has some left TMJ tenderness but this pain is not consistent with the headache pain that he is having.  Eyes: Conjunctivae are normal. Pupils are equal, round, and reactive to light.  Neck: Normal range of motion. Neck supple.  Cardiovascular: Normal rate, regular rhythm and normal heart sounds.   Pulmonary/Chest: Effort normal.  Musculoskeletal: Normal range of motion. He exhibits no edema.  Lymphadenopathy:    He has no cervical adenopathy.  Neurological: He is alert and oriented to person, place, and time.  Skin: Skin is warm and dry.  Psychiatric: He has a normal mood and affect.  Vitals reviewed.   ED Course  Procedures (including critical care time)  Labs Review Labs Reviewed - No data to display  Imaging Review No results found.   Visual Acuity Review  Right Eye Distance:   Left Eye Distance:   Bilateral Distance:    Right Eye Near:   Left Eye Near:    Bilateral Near:         MDM   1. Acute intractable headache, unspecified headache type     Because of the intensity of t this is fair to headache and failure of one t regimen already I'm recom CT of the head. Since is no tach here today or this evening recommend he go to the ED of his choice which [facility]. Explained to him that I think that they will go ahead and do a CT scan of his head tonight. However I did not fill couple trying any type of treatment or modality until CT scan is done with his history  and the intensity of his headache.     Note: This dictation was prepared with Dragon dictation along with smaller phrase technology. Any transcriptional errors that result from this process are unintentional.   Hassan RowanEugene Benjimen Kelley, MD 04/27/16 1914

## 2016-04-27 NOTE — Discharge Instructions (Signed)

## 2016-04-28 ENCOUNTER — Emergency Department
Admission: EM | Admit: 2016-04-28 | Discharge: 2016-04-28 | Disposition: A | Discharge status: Home / Self Care | Payer: Managed Care, Other (non HMO) | Attending: Emergency Medicine | Admitting: Emergency Medicine

## 2016-04-28 DIAGNOSIS — R51 Headache: Secondary | ICD-10-CM

## 2016-04-28 DIAGNOSIS — R519 Headache, unspecified: Secondary | ICD-10-CM

## 2016-04-28 DIAGNOSIS — T700XXA Otitic barotrauma, initial encounter: Secondary | ICD-10-CM

## 2016-04-28 LAB — BASIC METABOLIC PANEL
ANION GAP: 7 (ref 5–15)
BUN: 14 mg/dL (ref 6–20)
CALCIUM: 9 mg/dL (ref 8.9–10.3)
CO2: 27 mmol/L (ref 22–32)
Chloride: 103 mmol/L (ref 101–111)
Creatinine, Ser: 0.92 mg/dL (ref 0.61–1.24)
Glucose, Bld: 97 mg/dL (ref 65–99)
Potassium: 4 mmol/L (ref 3.5–5.1)
SODIUM: 137 mmol/L (ref 135–145)

## 2016-04-28 LAB — SEDIMENTATION RATE: Sed Rate: 17 mm/hr — ABNORMAL HIGH (ref 0–15)

## 2016-04-28 LAB — CBC WITH DIFFERENTIAL/PLATELET
BASOS ABS: 0 10*3/uL (ref 0–0.1)
BASOS PCT: 1 %
Eosinophils Absolute: 0.2 10*3/uL (ref 0–0.7)
Eosinophils Relative: 3 %
HEMATOCRIT: 42.3 % (ref 40.0–52.0)
Hemoglobin: 14.8 g/dL (ref 13.0–18.0)
Lymphocytes Relative: 35 %
Lymphs Abs: 2.3 10*3/uL (ref 1.0–3.6)
MCH: 30.2 pg (ref 26.0–34.0)
MCHC: 34.9 g/dL (ref 32.0–36.0)
MCV: 86.5 fL (ref 80.0–100.0)
MONO ABS: 0.7 10*3/uL (ref 0.2–1.0)
Monocytes Relative: 11 %
NEUTROS ABS: 3.4 10*3/uL (ref 1.4–6.5)
Neutrophils Relative %: 52 %
PLATELETS: 165 10*3/uL (ref 150–440)
RBC: 4.89 MIL/uL (ref 4.40–5.90)
RDW: 12.6 % (ref 11.5–14.5)
WBC: 6.7 10*3/uL (ref 3.8–10.6)

## 2016-04-28 LAB — MONONUCLEOSIS SCREEN: MONO SCREEN: NEGATIVE

## 2016-04-28 LAB — CK: CK TOTAL: 50 U/L (ref 49–397)

## 2016-04-28 MED ORDER — PROCHLORPERAZINE EDISYLATE 5 MG/ML IJ SOLN
5.0000 mg | Freq: Once | INTRAMUSCULAR | Status: AC
Start: 2016-04-28 — End: 2016-04-28
  Administered 2016-04-28: 5 mg via INTRAVENOUS
  Filled 2016-04-28: qty 2

## 2016-04-28 MED ORDER — KETOROLAC TROMETHAMINE 30 MG/ML IJ SOLN: 10.0000 mg | Freq: Once | INTRAMUSCULAR | Status: DC

## 2016-04-28 MED ORDER — SODIUM CHLORIDE 0.9 % IV BOLUS (SEPSIS)
1000.0000 mL | Freq: Once | INTRAVENOUS | Status: AC
  Administered 2016-04-28: 1000 mL via INTRAVENOUS

## 2016-04-28 MED ORDER — METHYLPREDNISOLONE 4 MG PO TBPK: ORAL_TABLET | ORAL | Status: DC

## 2016-04-28 MED ORDER — PROCHLORPERAZINE EDISYLATE 5 MG/ML IJ SOLN: 5.0000 mg | Freq: Once | INTRAMUSCULAR | Status: DC

## 2016-04-28 MED ORDER — DEXAMETHASONE SODIUM PHOSPHATE 10 MG/ML IJ SOLN
INTRAMUSCULAR | Status: AC
  Administered 2016-04-28: 10 mg via INTRAVENOUS
  Filled 2016-04-28: qty 1

## 2016-04-28 MED ORDER — DEXAMETHASONE SODIUM PHOSPHATE 10 MG/ML IJ SOLN
10.0000 mg | Freq: Once | INTRAMUSCULAR | Status: AC
  Administered 2016-04-28: 10 mg via INTRAVENOUS

## 2016-04-28 MED ORDER — KETOROLAC TROMETHAMINE 30 MG/ML IJ SOLN
10.0000 mg | Freq: Once | INTRAMUSCULAR | Status: AC
  Administered 2016-04-28: 9.9 mg via INTRAVENOUS
  Filled 2016-04-28: qty 1

## 2016-04-28 MED ORDER — PROCHLORPERAZINE MALEATE 10 MG PO TABS: 10.0000 mg | ORAL_TABLET | Freq: Four times a day (QID) | ORAL | Status: DC | PRN

## 2016-04-28 MED ORDER — DIPHENHYDRAMINE HCL 50 MG/ML IJ SOLN
25.0000 mg | Freq: Once | INTRAMUSCULAR | Status: AC
  Administered 2016-04-28: 25 mg via INTRAVENOUS
  Filled 2016-04-28: qty 1

## 2016-04-28 NOTE — Discharge Instructions (Signed)
1. Take steroid as prescribed (Medrol Dosepak). 2. You may take Compazine (#20) as needed for headache. 3. Return to the ER for worsening symptoms, persistent vomiting, lethargy or other concerns.  General Headache Without Cause A headache is pain or discomfort felt around the head or neck area. The specific cause of a headache may not be found. There are many causes and types of headaches. A few common ones are:  Tension headaches.  Migraine headaches.  Cluster headaches.  Chronic daily headaches. HOME CARE INSTRUCTIONS  Watch your condition for any changes. Take these steps to help with your condition: Managing Pain  Take over-the-counter and prescription medicines only as told by your health care provider.  Lie down in a dark, quiet room when you have a headache.  If directed, apply ice to the head and neck area:  Put ice in a plastic bag.  Place a towel between your skin and the bag.  Leave the ice on for 20 minutes, 2-3 times per day.  Use a heating pad or hot shower to apply heat to the head and neck area as told by your health care provider.  Keep lights dim if bright lights bother you or make your headaches worse. Eating and Drinking  Eat meals on a regular schedule.  Limit alcohol use.  Decrease the amount of caffeine you drink, or stop drinking caffeine. General Instructions  Keep all follow-up visits as told by your health care provider. This is important.  Keep a headache journal to help find out what may trigger your headaches. For example, write down:  What you eat and drink.  How much sleep you get.  Any change to your diet or medicines.  Try massage or other relaxation techniques.  Limit stress.  Sit up straight, and do not tense your muscles.  Do not use tobacco products, including cigarettes, chewing tobacco, or e-cigarettes. If you need help quitting, ask your health care provider.  Exercise regularly as told by your health care  provider.  Sleep on a regular schedule. Get 7-9 hours of sleep, or the amount recommended by your health care provider. SEEK MEDICAL CARE IF:   Your symptoms are not helped by medicine.  You have a headache that is different from the usual headache.  You have nausea or you vomit.  You have a fever. SEEK IMMEDIATE MEDICAL CARE IF:   Your headache becomes severe.  You have repeated vomiting.  You have a stiff neck.  You have a loss of vision.  You have problems with speech.  You have pain in the eye or ear.  You have muscular weakness or loss of muscle control.  You lose your balance or have trouble walking.  You feel faint or pass out.  You have confusion.   This information is not intended to replace advice given to you by your health care provider. Make sure you discuss any questions you have with your health care provider.   Document Released: 11/02/2005 Document Revised: 07/24/2015 Document Reviewed: 02/25/2015 Elsevier Interactive Patient Education 2016 [employer]  [employer] Barotitis media is inflammation of your middle ear. This occurs when the auditory tube (eustachian tube) leading from the back of your nose (nasopharynx) to your eardrum is blocked. This blockage may result from a cold, environmental allergies, or an upper respiratory infection. Unresolved barotitis media may lead to damage or hearing loss (barotrauma), which may become permanent. HOME CARE INSTRUCTIONS   Use medicines as recommended by your health care provider. Over-the-counter  medicines will help unblock the canal and can help during times of air travel.  Do not put anything into your ears to clean or unplug them. Eardrops will not be helpful.  Do not swim, dive, or fly until your health care provider says it is all right to do so. If these activities are necessary, chewing gum with frequent, forceful swallowing may help. It is also helpful to hold your nose and gently blow to pop  your ears for equalizing pressure changes. This forces air into the eustachian tube.  Only take over-the-counter or prescription medicines for pain, discomfort, or fever as directed by your health care provider.  A decongestant may be helpful in decongesting the middle ear and make pressure equalization easier. SEEK MEDICAL CARE IF:  You experience a serious form of dizziness in which you feel as if the room is spinning and you feel nauseated (vertigo).  Your symptoms only involve one ear. SEEK IMMEDIATE MEDICAL CARE IF:   You develop a severe headache, dizziness, or severe ear pain.  You have bloody or pus-like drainage from your ears.  You develop a fever.  Your problems do not improve or become worse. MAKE SURE YOU:   Understand these instructions.  Will watch your condition.  Will get help right away if you are not doing well or get worse.   This information is not intended to replace advice given to you by your health care provider. Make sure you discuss any questions you have with your health care provider.   Document Released: 10/30/2000 Document Revised: 08/23/2013 Document Reviewed: 05/30/2013 Elsevier Interactive Patient Education [employer]

## 2016-04-28 NOTE — ED Provider Notes (Signed)
[facility] Emergency Department Provider Note   ____________________________________________  Time seen: Approximately 12:53 AM  I have reviewed the triage vital signs and the nursing notes.   HISTORY  Chief Complaint Headache    HPI Edward Spencer is a 45 y.o. male who presents to the ED from medical urgent care for CT headsecondary to headache. Patient has a history of diet controlled diabetes mellitus who complains of severe headache 4 days. 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 Complains of acute onset approximately noon on Friday associated with body aches and chills. [facility] who diagnosed him with sinusitis and placed him on Augmentin. Patient is halfway through his course of Augmentin and states he feels worse. He was seen at medical in urgent care yesterday afternoon and referred to the ED for head CT. Complains of frontal headache associated with frontal sinus pressure, body aches and occasional nausea. Denies associated fever, neck pain, vision changes, chest pain, shortness of breath, abdominal pain, dysuria, diarrhea. Denies recent travel or trauma. Denies recent tick bite. Has tried OTC Aleve without pain relief. States headache seems to be waxing/waning and worse in the evenings.   Past Medical History  Diagnosis Date  . Asthma   . Arthritis   . Depression   . Diabetes mellitus without complication ([place])   . History of chicken pox   . Allergy     Patient Active Problem List   Diagnosis Date Noted  . Headache 02/02/2016  . Health care maintenance 09/29/2015  . Right elbow pain 08/11/2015  . Left shoulder pain 08/11/2015  . Environmental allergies 08/11/2015  . Diabetes mellitus ([place]) 08/09/2015    History reviewed. No pertinent past surgical history.  Current Outpatient Rx  Name  Route  Sig  Dispense  Refill  . loratadine (CLARITIN) 10 MG tablet   Oral   Take 10 mg by mouth  daily.         . naproxen sodium (ANAPROX) 220 MG tablet   Oral   Take 220 mg by mouth daily.         . methylPREDNISolone (MEDROL DOSEPAK) 4 MG TBPK tablet      Take as directed   21 tablet   0   . prochlorperazine (COMPAZINE) 10 MG tablet   Oral   Take 1 tablet (10 mg total) by mouth every 6 (six) hours as needed for nausea.   20 tablet   0     Allergies Review of patient's allergies indicates no known allergies.  Family History  Problem Relation Age of Onset  . Arthritis Mother   . Hyperlipidemia Mother   . Arthritis Father   . Hypertension Father   . Diabetes Father   . Mental illness Maternal Aunt   . Hyperlipidemia Maternal Aunt   . Alcohol abuse Maternal Aunt   . Mental illness Paternal Aunt   . Alcohol abuse Paternal Aunt   . Arthritis Paternal Uncle   . Arthritis Maternal Grandmother   . Arthritis Paternal Grandmother   . Hyperlipidemia Paternal Grandmother   . Diabetes Paternal Grandmother     Social History Social History  Substance Use Topics  . Smoking status: Current Every Day Smoker    Types: Cigarettes  . Smokeless tobacco: Never Used  . Alcohol Use: 0.0 oz/week    0 Standard drinks or equivalent per week     Comment: social    Review of Systems  Constitutional: No  fever. Positive for chills. Eyes: No visual changes. ENT: No sore throat. Cardiovascular: Denies chest pain. Respiratory: Denies shortness of breath. Gastrointestinal: No abdominal pain.  Positive for occasional nausea, no vomiting.  No diarrhea.  No constipation. Genitourinary: Negative for dysuria. Musculoskeletal: Negative for back pain. Skin: Negative for rash. Neurological: Positive for headache. Negative for focal weakness or numbness.  10-point ROS otherwise negative.  ____________________________________________   PHYSICAL EXAM:  VITAL SIGNS: ED Triage Vitals  Enc Vitals Group     BP 04/27/16 2010 123/68 mmHg     Pulse Rate 04/27/16 2010 64     Resp  04/27/16 2010 18     Temp 04/27/16 2010 98.9 F (37.2 C)     Temp Source 04/27/16 2010 Oral     SpO2 04/27/16 2010 98 %     Weight 04/27/16 2010 224 lb 5 oz (101.747 kg)     Height 04/27/16 2010 _0  (1.854 m)     Head Cir --      Peak Flow --      Pain Score 04/28/16 0036 8     Pain Loc --      Pain Edu? --      Excl. in [place]? --     Constitutional: Alert and oriented. Well appearing and in no acute distress. Eyes: Conjunctivae are normal. PERRL. EOMI. Head: Atraumatic. Frontal sinus and temples tender to palpation. Ears: Bilateral TMs with moderate fluid, no erythema or rupture. Nose: No congestion/rhinnorhea. Mouth/Throat: Mucous membranes are moist.  Oropharynx non-erythematous. Neck: No stridor.  Supple neck without meningismus.  No carotid bruits. Cardiovascular: Normal rate, regular rhythm. Grossly normal heart sounds.  Good peripheral circulation. Respiratory: Normal respiratory effort.  No retractions. Lungs CTAB. Gastrointestinal: Soft and nontender. No distention. No abdominal bruits. No CVA tenderness. Musculoskeletal: No lower extremity tenderness nor edema.  No joint effusions. Neurologic:  Normal speech and language. No gross focal neurologic deficits are appreciated. No gait instability. Skin:  Skin is warm, dry and intact. No rash noted. Specifically, no petechiae.  Psychiatric: Mood and affect are normal. Speech and behavior are normal.  ____________________________________________   LABS (all labs ordered are listed, but only abnormal results are displayed)  Labs Reviewed  SEDIMENTATION RATE - Abnormal; Notable for the following:    Sed Rate 17 (*)    All other components within normal limits  CULTURE, BLOOD (ROUTINE X 2)  CULTURE, BLOOD (ROUTINE X 2)  CBC WITH DIFFERENTIAL/PLATELET  BASIC METABOLIC PANEL  CK  MONONUCLEOSIS SCREEN    ____________________________________________  EKG  None ____________________________________________  RADIOLOGY  CT head without contrast interpreted per Dr. Golden Circle: No acute intracranial abnormality is noted. ____________________________________________   PROCEDURES  Procedure(s) performed: None  Critical Care performed: No  ____________________________________________   INITIAL IMPRESSION / ASSESSMENT AND PLAN / ED COURSE  Pertinent labs & imaging results that were available during my care of the patient were reviewed by me and considered in my medical decision making (see chart for details).  45 year old male who presents with a 4 day history of frontal headache. He is well appearing with supple neck without focal neurological deficits. CT head negative for acute intracranial abnormalities. Will obtain screening lab work including CK, ESR. Will try IV fluids and headache cocktail.  ----------------------------------------- 2:57 AM on 04/28/2016 -----------------------------------------  Patient improved after headache cocktail. Neck remains supple without focal neurological deficits. Updated patient and spouse of laboratory results. Plan for Medrol Dosepak, Compazine to take as needed for headache and neurology follow-up. Strict  return precautions given. Both verbalize understanding and agree with plan of care.  ____________________________________________   FINAL CLINICAL IMPRESSION(S) / ED DIAGNOSES  Final diagnoses:  Headache, unspecified headache type  Barotitis media, initial encounter      NEW MEDICATIONS STARTED DURING THIS VISIT:  New Prescriptions   METHYLPREDNISOLONE (MEDROL DOSEPAK) 4 MG TBPK TABLET    Take as directed   PROCHLORPERAZINE (COMPAZINE) 10 MG TABLET    Take 1 tablet (10 mg total) by mouth every 6 (six) hours as needed for nausea.     Note:  This document was prepared using Dragon voice recognition software and may include  unintentional dictation errors.    Paulette Blanch, MD 04/28/16 [phone]

## 2016-04-28 NOTE — ED Notes (Signed)
Discharge instructions reviewed with patient. Patient verbalized understanding. Patient ambulated to lobby without difficulty.   

## 2016-05-03 LAB — CULTURE, BLOOD (ROUTINE X 2)
Culture: NO GROWTH
Culture: NO GROWTH

## 2016-05-15 ENCOUNTER — Other Ambulatory Visit: Payer: 59

## 2016-05-22 ENCOUNTER — Ambulatory Visit: Payer: 59 | Admitting: Internal Medicine

## 2018-12-22 ENCOUNTER — Ambulatory Visit: Payer: BLUE CROSS/BLUE SHIELD | Admitting: Family Medicine

## 2018-12-22 ENCOUNTER — Encounter: Payer: Self-pay | Admitting: Family Medicine

## 2018-12-22 VITALS — BP 112/64 | HR 72 | Temp 98.8°F | Resp 16 | Ht 73.0 in | Wt 255.6 lb

## 2018-12-22 DIAGNOSIS — E119 Type 2 diabetes mellitus without complications: Secondary | ICD-10-CM | POA: Diagnosis not present

## 2018-12-22 LAB — POCT GLYCOSYLATED HEMOGLOBIN (HGB A1C)
HBA1C, POC (CONTROLLED DIABETIC RANGE): 8.8 % — AB (ref 0.0–7.0)
HEMOGLOBIN A1C: 8.8 % (ref 4.0–5.6)
HEMOGLOBIN A1C: 8.8 % — AB (ref 4.0–5.6)
HbA1c, POC (prediabetic range): 8.8 % — AB (ref 5.7–6.4)

## 2018-12-22 MED ORDER — EMPAGLIFLOZIN 25 MG PO TABS
25.0000 mg | ORAL_TABLET | Freq: Every day | ORAL | 1 refills | Status: DC
Start: 2018-12-22 — End: 2019-07-10

## 2018-12-22 NOTE — Progress Notes (Signed)
Subjective:    Patient ID: Edward Spencer, male    DOB: 09/09/1971, 48 y.o.   MRN: [id number]  HPI   Patient presents to clinic for discussion of his blood sugars.  He was last seen here in 2017.  States he was taking metformin previously, but stopped taking this.  Patient stopped taking metformin because he read online that it can destroy your kidneys.  Over the past few years he has not monitored his blood sugars closely, wishes trying to eat healthier and monitor his symptoms.  Starting to begin noticing over the past few weeks he was feeling more sluggish at times.  Checked his blood sugar and it was 312.  Patient denies any symptoms of hypoglycemia.  Patient Active Problem List   Diagnosis Date Noted  . Headache 02/02/2016  . Health care maintenance 09/29/2015  . Right elbow pain 08/11/2015  . Left shoulder pain 08/11/2015  . Environmental allergies 08/11/2015  . Diabetes mellitus ([facility]) 08/09/2015   Social History   Tobacco Use  . Smoking status: Current Every Day Smoker    Types: Cigarettes  . Smokeless tobacco: Never Used  Substance Use Topics  . Alcohol use: Yes    Alcohol/week: 0.0 standard drinks    Comment: social   Review of Systems   Constitutional: Negative for chills, and fever. +sluggish at times HENT: Negative for congestion, ear pain, sinus pain and sore throat.   Eyes: Negative.   Respiratory: Negative for cough, shortness of breath and wheezing.   Cardiovascular: Negative for chest pain, palpitations and leg swelling.  Gastrointestinal: Negative for abdominal pain, diarrhea, nausea and vomiting.  Genitourinary: Negative for dysuria, frequency and urgency.  Musculoskeletal: Negative for arthralgias and myalgias.  Skin: Negative for color change, pallor and rash.  Neurological: Negative for syncope, light-headedness and headaches.  Psychiatric/Behavioral: The patient is not nervous/anxious.       Objective:   Physical Exam  Constitutional: He appears  well-developed and well-nourished. No distress.  HENT:  Head: Normocephalic and atraumatic.  Eyes: Pupils are equal, round, and reactive to light. Conjunctivae and EOM are normal. No scleral icterus.  Neck: Normal range of motion. Neck supple. No tracheal deviation present.  Cardiovascular: Normal rate, regular rhythm and normal heart sounds.  Pulmonary/Chest: Effort normal and breath sounds normal. No respiratory distress. He has no wheezes. He has no rales.  Abdominal: Soft. Bowel sounds are normal. There is no tenderness.  Neurological: He is alert and oriented to person, place, and time.  Gait normal  Skin: Skin is warm and dry. He is not diaphoretic. No pallor.  Psychiatric: He has a normal mood and affect. His behavior is normal. Thought content normal.   Nursing note and vitals reviewed.  Vitals:   12/22/18 1355  BP: 112/64  Pulse: 72  Resp: 16  Temp: 98.8 F (37.1 C)  SpO2: 94%   Lab Results  Component Value Date   HGBA1C 8.8 (A) 12/22/2018   HGBA1C 8.8 12/22/2018   HGBA1C 8.8 (A) 12/22/2018   HGBA1C 8.8 (A) 12/22/2018      Assessment & Plan:    A total of 25  minutes were spent face-to-face with the patient during this encounter and over half of that time was spent on counseling and coordination of care. The patient was counseled on diabetes sugar goals, medications, how uncontrolled diabetes effects the body.   Type 2 diabetes - patient's A1c done today it is 8.8%.  A1c back in 2017 was 7.8%.  We  will get him back on a oral diabetes medication.  Patient would prefer only to start 1 at this time and then work on his diet and monitor his blood sugars.  He refuses to do metformin, but is agreeable to try Jardiance.  We will also get fasting lab work so we can look at his blood counts, electrolytes, liver, kidney, thyroid and cholesterol.  Encouraged healthy diet including low carbs, avoiding excess sugar, lots of lean proteins and vegetables and also discussed regular  physical activity.   Patient will schedule fasting lab appointment, and follow-up here in approximately 6 weeks for recheck on how his blood sugars are responding to Jardiance.  Patient already has a glucose monitor at home and monitoring supplies, does not need a new prescription for meter or test strips.

## 2018-12-30 ENCOUNTER — Other Ambulatory Visit (INDEPENDENT_AMBULATORY_CARE_PROVIDER_SITE_OTHER): Payer: BLUE CROSS/BLUE SHIELD

## 2018-12-30 DIAGNOSIS — E119 Type 2 diabetes mellitus without complications: Secondary | ICD-10-CM

## 2018-12-30 LAB — CBC WITH DIFFERENTIAL/PLATELET
BASOS ABS: 0 10*3/uL (ref 0.0–0.1)
BASOS PCT: 0.5 % (ref 0.0–3.0)
Eosinophils Absolute: 0.3 10*3/uL (ref 0.0–0.7)
Eosinophils Relative: 4.6 % (ref 0.0–5.0)
HCT: 44.4 % (ref 39.0–52.0)
Hemoglobin: 15.2 g/dL (ref 13.0–17.0)
LYMPHS ABS: 2 10*3/uL (ref 0.7–4.0)
Lymphocytes Relative: 32.9 % (ref 12.0–46.0)
MCHC: 34.2 g/dL (ref 30.0–36.0)
MCV: 89.4 fl (ref 78.0–100.0)
MONOS PCT: 8.2 % (ref 3.0–12.0)
Monocytes Absolute: 0.5 10*3/uL (ref 0.1–1.0)
NEUTROS ABS: 3.3 10*3/uL (ref 1.4–7.7)
NEUTROS PCT: 53.8 % (ref 43.0–77.0)
PLATELETS: 165 10*3/uL (ref 150.0–400.0)
RBC: 4.97 Mil/uL (ref 4.22–5.81)
RDW: 12.7 % (ref 11.5–15.5)
WBC: 6.2 10*3/uL (ref 4.0–10.5)

## 2018-12-30 LAB — COMPREHENSIVE METABOLIC PANEL
AG Ratio: 1.7 (calc) (ref 1.0–2.5)
ALT: 27 U/L (ref 9–46)
AST: 21 U/L (ref 10–40)
Albumin: 4.5 g/dL (ref 3.6–5.1)
Alkaline phosphatase (APISO): 61 U/L (ref 36–130)
BUN: 17 mg/dL (ref 7–25)
CO2: 24 mmol/L (ref 20–32)
CREATININE: 1.16 mg/dL (ref 0.60–1.35)
Calcium: 9.4 mg/dL (ref 8.6–10.3)
Chloride: 105 mmol/L (ref 98–110)
GLOBULIN: 2.6 g/dL (ref 1.9–3.7)
GLUCOSE: 149 mg/dL — AB (ref 65–99)
Potassium: 4.7 mmol/L (ref 3.5–5.3)
Sodium: 141 mmol/L (ref 135–146)
Total Bilirubin: 0.6 mg/dL (ref 0.2–1.2)
Total Protein: 7.1 g/dL (ref 6.1–8.1)

## 2018-12-30 LAB — LIPID PANEL
CHOL/HDL RATIO: 7.8 (calc) — AB (ref <5.0)
Cholesterol: 219 mg/dL — ABNORMAL HIGH (ref <200)
HDL: 28 mg/dL — AB (ref >=40)
LDL CHOLESTEROL (CALC): 151 mg/dL — AB
Non-HDL Cholesterol (Calc): 191 mg/dL (calc) — ABNORMAL HIGH (ref <130)
TRIGLYCERIDES: 238 mg/dL — AB (ref <150)

## 2018-12-30 LAB — THYROID PANEL WITH TSH
FREE THYROXINE INDEX: 2.4 (ref 1.4–3.8)
T3 Uptake: 33 % (ref 22–35)
T4, Total: 7.4 ug/dL (ref 4.9–10.5)
TSH: 0.83 mIU/L (ref 0.40–4.50)

## 2018-12-30 NOTE — Addendum Note (Signed)
Addended by: Penne Lash on: 12/30/2018 08:46 AM   Modules accepted: Orders

## 2018-12-31 ENCOUNTER — Encounter: Payer: Self-pay | Admitting: Family Medicine

## 2019-02-02 ENCOUNTER — Other Ambulatory Visit: Payer: Self-pay

## 2019-02-02 ENCOUNTER — Ambulatory Visit (INDEPENDENT_AMBULATORY_CARE_PROVIDER_SITE_OTHER): Payer: Self-pay | Admitting: Internal Medicine

## 2019-02-02 ENCOUNTER — Encounter: Payer: Self-pay | Admitting: Internal Medicine

## 2019-02-02 DIAGNOSIS — E119 Type 2 diabetes mellitus without complications: Secondary | ICD-10-CM

## 2019-02-02 DIAGNOSIS — E7800 Pure hypercholesterolemia, unspecified: Secondary | ICD-10-CM

## 2019-02-02 DIAGNOSIS — Z9109 Other allergy status, other than to drugs and biological substances: Secondary | ICD-10-CM

## 2019-02-02 MED ORDER — ROSUVASTATIN CALCIUM 20 MG PO TABS: 20.0000 mg | ORAL_TABLET | Freq: Every day | ORAL | 2 refills | Status: DC

## 2019-02-02 NOTE — Progress Notes (Signed)
Patient ID: Edward Spencer, male   DOB: October 23, 1971, 48 y.o.   MRN: [id number]   Subjective:    Patient ID: Edward Spencer, male    DOB: [birth year], 48 y.o.   MRN: [id number]  HPI  Patient here for a scheduled follow up.  I have not seen him since 4.2017.  He saw Philis Nettle 12/22/18 with elevated sugar and feeling more sluggish.  Started on Jardiance.  Has adjusted his diet.  Sugars improved.  Brought in no sugar readings, but does report have improved.  Discussed diet and exercise.  No chest pain.  No sob.  No acid reflux.  No abdominal pain.  Bowels moving.  No urine change.  Discussed labs.  Discussed calculated cholesterol risk and need for cholesterol medication.     Past Medical History:  Diagnosis Date  . Allergy   . Arthritis   . Asthma   . Depression   . Diabetes mellitus without complication ([place])   . History of chicken pox    History reviewed. No pertinent surgical history. Family History  Problem Relation Age of Onset  . Arthritis Mother   . Hyperlipidemia Mother   . Arthritis Father   . Hypertension Father   . Diabetes Father   . Mental illness Maternal Aunt   . Hyperlipidemia Maternal Aunt   . Alcohol abuse Maternal Aunt   . Mental illness Paternal Aunt   . Alcohol abuse Paternal Aunt   . Arthritis Paternal Uncle   . Arthritis Maternal Grandmother   . Arthritis Paternal Grandmother   . Hyperlipidemia Paternal Grandmother   . Diabetes Paternal Grandmother    Social History   Socioeconomic History  . Marital status: Single    Spouse name: Not on file  . Number of children: Not on file  . Years of education: Not on file  . Highest education level: Not on file  Occupational History  . Not on file  Social Needs  . Financial resource strain: Not on file  . Food insecurity:    Worry: Not on file    Inability: Not on file  . Transportation needs:    Medical: Not on file    Non-medical: Not on file  Tobacco Use  . Smoking status: Current Every Day Smoker   Types: Cigarettes  . Smokeless tobacco: Never Used  Substance and Sexual Activity  . Alcohol use: Yes    Alcohol/week: 0.0 standard drinks    Comment: social  . Drug use: No  . Sexual activity: Not on file  Lifestyle  . Physical activity:    Days per week: Not on file    Minutes per session: Not on file  . Stress: Not on file  Relationships  . Social connections:    Talks on phone: Not on file    Gets together: Not on file    Attends religious service: Not on file    Active member of club or organization: Not on file    Attends meetings of clubs or organizations: Not on file    Relationship status: Not on file  Other Topics Concern  . Not on file  Social History Narrative  . Not on file    Outpatient Encounter Medications as of 02/02/2019  Medication Sig  . empagliflozin (JARDIANCE) 25 MG TABS tablet Take 25 mg by mouth daily.  Marland Kitchen loratadine (CLARITIN) 10 MG tablet Take 10 mg by mouth daily.  . rosuvastatin (CRESTOR) 20 MG tablet Take 1 tablet (20 mg total) by mouth  daily.   No facility-administered encounter medications on file as of 02/02/2019.     Review of Systems  Constitutional: Negative for appetite change and unexpected weight change.  HENT: Negative for congestion and sinus pressure.   Respiratory: Negative for cough, chest tightness and shortness of breath.   Cardiovascular: Negative for chest pain, palpitations and leg swelling.  Gastrointestinal: Negative for abdominal pain, diarrhea, nausea and vomiting.  Genitourinary: Negative for difficulty urinating and dysuria.  Musculoskeletal: Negative for joint swelling and myalgias.  Skin: Negative for color change and rash.  Neurological: Negative for dizziness, light-headedness and headaches.  Psychiatric/Behavioral: Negative for agitation and dysphoric mood.       Objective:    Physical Exam Constitutional:      General: He is not in acute distress.    Appearance: Normal appearance. He is well-developed.   HENT:     Nose: Nose normal. No congestion.     Mouth/Throat:     Pharynx: No oropharyngeal exudate or posterior oropharyngeal erythema.  Cardiovascular:     Rate and Rhythm: Normal rate and regular rhythm.  Pulmonary:     Effort: Pulmonary effort is normal. No respiratory distress.     Breath sounds: Normal breath sounds.  Abdominal:     General: Bowel sounds are normal.     Palpations: Abdomen is soft.     Tenderness: There is no abdominal tenderness.  Musculoskeletal:        General: No swelling or tenderness.  Skin:    Findings: No erythema or rash.  Neurological:     Mental Status: He is alert.  Psychiatric:        Mood and Affect: Mood normal.        Behavior: Behavior normal.     BP 112/72   Pulse 71   Temp 98.4 F (36.9 C) (Oral)   Resp 16   Wt 252 lb (114.3 kg)   SpO2 96%   BMI 33.25 kg/m  Wt Readings from Last 3 Encounters:  02/02/19 252 lb (114.3 kg)  12/22/18 255 lb 9.6 oz (115.9 kg)  04/27/16 224 lb 5 oz (101.7 kg)     Lab Results  Component Value Date   WBC 6.2 12/30/2018   HGB 15.2 12/30/2018   HCT 44.4 12/30/2018   PLT 165.0 12/30/2018   GLUCOSE 149 (H) 12/30/2018   CHOL 219 (H) 12/30/2018   TRIG 238 (H) 12/30/2018   HDL 28 (L) 12/30/2018   LDLCALC 151 (H) 12/30/2018   ALT 27 12/30/2018   AST 21 12/30/2018   NA 141 12/30/2018   K 4.7 12/30/2018   CL 105 12/30/2018   CREATININE 1.16 12/30/2018   BUN 17 12/30/2018   CO2 24 12/30/2018   TSH 0.83 12/30/2018   PSA 0.45 08/09/2015   HGBA1C 8.8 (A) 12/22/2018   HGBA1C 8.8 12/22/2018   HGBA1C 8.8 (A) 12/22/2018   HGBA1C 8.8 (A) 12/22/2018   MICROALBUR 0.9 08/09/2015       Assessment & Plan:   Problem List Items Addressed This Visit    Diabetes mellitus ([place])    On Jardiance now.  Per his report, sugars doing better.  Discussed diet and exercise.  Follow met b and a1c.        Relevant Medications   rosuvastatin (CRESTOR) 20 MG tablet   Other Relevant Orders   Hemoglobin O7P    Basic metabolic panel   Microalbumin / creatinine urine ratio   Environmental allergies    Controlled.  Hypercholesterolemia    Low cholesterol diet and exercise.  Discussed recommendation to start cholesterol medication.  He is agreeable. Start crestor 2m q day.  Follow lipid panel and liver function tests.        Relevant Medications   rosuvastatin (CRESTOR) 20 MG tablet   Other Relevant Orders   Hepatic function panel   Lipid panel       CEinar Pheasant MD

## 2019-02-05 ENCOUNTER — Encounter: Payer: Self-pay | Admitting: Internal Medicine

## 2019-02-05 DIAGNOSIS — E7800 Pure hypercholesterolemia, unspecified: Secondary | ICD-10-CM | POA: Insufficient documentation

## 2019-02-05 NOTE — Assessment & Plan Note (Signed)
On Jardiance now.  Per his report, sugars doing better.  Discussed diet and exercise.  Follow met b and a1c.

## 2019-02-05 NOTE — Assessment & Plan Note (Signed)
Low cholesterol diet and exercise.  Discussed recommendation to start cholesterol medication.  He is agreeable. Start crestor 20mg  q day.  Follow lipid panel and liver function tests.

## 2019-02-05 NOTE — Assessment & Plan Note (Signed)
Controlled.  

## 2019-03-24 ENCOUNTER — Encounter: Payer: Self-pay | Admitting: Internal Medicine

## 2019-04-11 ENCOUNTER — Other Ambulatory Visit: Payer: Self-pay

## 2019-04-12 ENCOUNTER — Encounter: Payer: Self-pay | Admitting: Internal Medicine

## 2019-04-19 ENCOUNTER — Encounter: Payer: Self-pay | Admitting: Internal Medicine

## 2019-04-20 NOTE — Telephone Encounter (Signed)
Pt tested positive for covid. See other my chart message

## 2019-04-20 NOTE — Telephone Encounter (Signed)
Spoke with Lennox Laity since unable to reach pt. Pt tested positive last night. Originally went to be tested for work because multiple people had tested positive. Thought he had sinus infection because he has what felt like a sinus head ache. Patient ran a low grade fever last night of 99.9. No new symptoms noted today per Lennox Laity. [facility] have all [facility] [place]. Daughter has been taken out of work for 2 weeks. The only people that live in house with him is Lennox Laity and daughter. Jodi's mother was tested as well since she was around him. They are all quarantined as of today.

## 2019-04-20 NOTE — Telephone Encounter (Signed)
Can schedule a virtual appt for him - to discuss symptoms, quarantine, etc.

## 2019-04-20 NOTE — Telephone Encounter (Signed)
Need more information.  When/where tested?  Is he quarantined?  Symptoms?  Who does he want tested?  Who lives in house with him?  Are they quarantined?

## 2019-04-20 NOTE — Telephone Encounter (Signed)
Pt scheduled  

## 2019-04-20 NOTE — Telephone Encounter (Signed)
Left message to call back  

## 2019-04-21 ENCOUNTER — Other Ambulatory Visit: Payer: Self-pay

## 2019-04-21 ENCOUNTER — Ambulatory Visit (INDEPENDENT_AMBULATORY_CARE_PROVIDER_SITE_OTHER): Payer: Managed Care, Other (non HMO) | Admitting: Internal Medicine

## 2019-04-21 ENCOUNTER — Encounter: Payer: Self-pay | Admitting: Internal Medicine

## 2019-04-21 ENCOUNTER — Telehealth: Payer: Self-pay | Admitting: Internal Medicine

## 2019-04-21 DIAGNOSIS — E119 Type 2 diabetes mellitus without complications: Secondary | ICD-10-CM

## 2019-04-21 DIAGNOSIS — U071 COVID-19: Secondary | ICD-10-CM | POA: Diagnosis not present

## 2019-04-21 NOTE — Telephone Encounter (Signed)
My chart message sent for update.  

## 2019-04-21 NOTE — Progress Notes (Signed)
Patient ID: Edward Spencer, male   DOB: [birth year], 48 y.o.   MRN: [id number]   Virtual Visit via virtual Note  This visit type was conducted due to national recommendations for restrictions regarding the COVID-19 pandemic (e.g. social distancing).  This format is felt to be most appropriate for this patient at this time.  All issues noted in this document were discussed and addressed.  No physical exam was performed (except for noted visual exam findings with Video Visits).   I connected with Edward Spencer today by a video enabled telemedicine application and verified that I am speaking with the correct person using two identifiers. Location patient: home Location provider: work  Persons participating in the virtual visit: patient, provider  I discussed the limitations, risks, security and privacy concerns of performing an evaluation and management service by video and the availability of in person appointments.  The patient expressed understanding and agreed to proceed.   Reason for visit: acute visit.   HPI: Recently diagnosed with COVID.  States since a coworker tested positive, he had to be tested prior to going to work.  Was tested and test returned positive for COVID.  Now having some sinus pressure and congestion.  Had headache.  Tmax 100.8.  Now fever has broken.  Having dry cough and a metalic smell.  Some nasal congestion.  No chest congestion. No sob.  No abdominal pain or diarrhea.  No energy.  Eating.  Has been quarantined and aware of quarantine restrictions and aware of requirements to return to work.  Discussed quarantine and discussed unable to return to work until fever free for three days without antipyretics.  Also has to wait at least 10 days with improved symptoms.  States sugars are doing well - averaging 130-140.     ROS: See pertinent positives and negatives per HPI.  Past Medical History:  Diagnosis Date  . Allergy   . Arthritis   . Asthma   . Depression   .  Diabetes mellitus without complication ([place])   . History of chicken pox     History reviewed. No pertinent surgical history.  Family History  Problem Relation Age of Onset  . Arthritis Mother   . Hyperlipidemia Mother   . Arthritis Father   . Hypertension Father   . Diabetes Father   . Mental illness Maternal Aunt   . Hyperlipidemia Maternal Aunt   . Alcohol abuse Maternal Aunt   . Mental illness Paternal Aunt   . Alcohol abuse Paternal Aunt   . Arthritis Paternal Uncle   . Arthritis Maternal Grandmother   . Arthritis Paternal Grandmother   . Hyperlipidemia Paternal Grandmother   . Diabetes Paternal Grandmother     SOCIAL HX: reviewed.    Current Outpatient Medications:  .  empagliflozin (JARDIANCE) 25 MG TABS tablet, Take 25 mg by mouth daily., Disp: 90 tablet, Rfl: 1 .  loratadine (CLARITIN) 10 MG tablet, Take 10 mg by mouth daily., Disp: , Rfl:  .  rosuvastatin (CRESTOR) 20 MG tablet, Take 1 tablet (20 mg total) by mouth daily., Disp: 30 tablet, Rfl: 2  EXAM:  GENERAL: alert, oriented, appears well and in no acute distress  HEENT: atraumatic, conjunttiva clear, no obvious abnormalities on inspection of external nose and ears  NECK: normal movements of the head and neck  LUNGS: on inspection no signs of respiratory distress, breathing rate appears normal, no obvious gross SOB, gasping or wheezing  CV: no obvious cyanosis  PSYCH/NEURO: pleasant and cooperative, no  obvious depression or anxiety, speech and thought processing grossly intact  ASSESSMENT AND PLAN:  Discussed the following assessment and plan:  Type 2 diabetes mellitus without complication, without long-term current use of insulin ([place])  COVID-19 virus detected  Diabetes mellitus On jardiance.  Doing well.  Sugars are better as outlined.  Follow met b and a1c.    COVID-19 virus detected Tested positive for COVID.  Feeling some better currently.  States fever broke.  Still with decreased energy.   Eating. No sob.  Rest. Fluids.  Stay quarantined as discussed.  Continue to keep me updated.  Follow.      I discussed the assessment and treatment plan with the patient. The patient was provided an opportunity to ask questions and all were answered. The patient agreed with the plan and demonstrated an understanding of the instructions.   The patient was advised to call back or seek an in-person evaluation if the symptoms worsen or if the condition fails to improve as anticipated.    Einar Pheasant, MD

## 2019-04-23 ENCOUNTER — Encounter: Payer: Self-pay | Admitting: Internal Medicine

## 2019-04-23 DIAGNOSIS — U071 COVID-19: Secondary | ICD-10-CM | POA: Insufficient documentation

## 2019-04-23 NOTE — Assessment & Plan Note (Signed)
Tested positive for COVID.  Feeling some better currently.  States fever broke.  Still with decreased energy.  Eating. No sob.  Rest. Fluids.  Stay quarantined as discussed.  Continue to keep me updated.  Follow.

## 2019-04-23 NOTE — Assessment & Plan Note (Signed)
On jardiance.  Doing well.  Sugars are better as outlined.  Follow met b and a1c.

## 2019-05-01 NOTE — Telephone Encounter (Signed)
Spoke with pt and he stated that he has not a fever. He stated the highest his temp has been in two or three days was a "little over 99.0" Pt stated that the symptoms he is having now is the smell of infection in his nose, the taste of infection when the drainage drains down his throat, and sinus headache/pressure. Pt stated no fever, no cough, no SOBr. He also stated that he is prone to sinus infections this time of year. Pt is also aware that he cannot go back to work and must stay quarantined for now. Pt stated that he is not allowed to go back to work until he gets a negative covid-19 test result and he spoke with the [facility] about that and they told him that would probably be a month before his test result would show negative. Pt is wanting to know if an antibiotic can be called in?

## 2019-05-01 NOTE — Telephone Encounter (Signed)
Pt has appt scheduled 05/02/19.

## 2019-05-01 NOTE — Telephone Encounter (Signed)
See my chart message.  He was recently diagnosed with covid.  Has been out of work.  Please clarify symptoms having now.  If worsening symptoms and return of fever, will need to continue to stay home and not return to work.  Will need to continue quarantine for now.  Can schedule appt with me tomorrow if agreeable.  Let me know if any problems.  Thanks

## 2019-05-01 NOTE — Telephone Encounter (Signed)
Yes I told him that you would probably want to wait and talk with him tomorrow before sending any medications in.

## 2019-05-01 NOTE — Telephone Encounter (Signed)
I saw he was on my schedule for tomorrow, so is he ok to wait until appt for me to call in medication.  If so, I will d/w him then.  Thanks

## 2019-05-02 ENCOUNTER — Ambulatory Visit (INDEPENDENT_AMBULATORY_CARE_PROVIDER_SITE_OTHER): Payer: Managed Care, Other (non HMO) | Admitting: Internal Medicine

## 2019-05-02 ENCOUNTER — Encounter: Payer: Self-pay | Admitting: Internal Medicine

## 2019-05-02 ENCOUNTER — Other Ambulatory Visit: Payer: Self-pay

## 2019-05-02 DIAGNOSIS — U071 COVID-19: Secondary | ICD-10-CM | POA: Diagnosis not present

## 2019-05-02 DIAGNOSIS — E119 Type 2 diabetes mellitus without complications: Secondary | ICD-10-CM | POA: Diagnosis not present

## 2019-05-02 DIAGNOSIS — J019 Acute sinusitis, unspecified: Secondary | ICD-10-CM

## 2019-05-02 MED ORDER — AMOXICILLIN 875 MG PO TABS: 875.0000 mg | ORAL_TABLET | Freq: Two times a day (BID) | ORAL | 0 refills | Status: DC

## 2019-05-02 NOTE — Progress Notes (Signed)
Patient ID: Edward Spencer, male   DOB: 1971/10/26, 48 y.o.   MRN: [id number]   Virtual Visit via video Note  This visit type was conducted due to national recommendations for restrictions regarding the COVID-19 pandemic (e.g. social distancing).  This format is felt to be most appropriate for this patient at this time.  All issues noted in this document were discussed and addressed.  No physical exam was performed (except for noted visual exam findings with Video Visits).   I connected with Charlett Lango by a video enabled telemedicine application or telephone and verified that I am speaking with the correct person using two identifiers. Location patient: home Location provider: work Persons participating in the virtual visit: patient, provider  I discussed the limitations, risks, security and privacy concerns of performing an evaluation and management service by video and the availability of in person appointments. The patient expressed understanding and agreed to proceed.   Reason for visit: acute visit  HPI: He was recently diagnosed with covid.  Has been home - quarantined.  Feeling better.  No body aches.  Fever had resolved.  No chest congestion, sob or cough.  Eating and drinking well.  Has been working outside.  Does report that recently started having symptoms c/w sinus infection.  Temp increased - 99.5.  On no tylenol or ibuprofen.  Increased sinus pressure and drainage.  Feels similar to his previous sinus infections.  No chest congestion, cough or sob.  No acid reflux.  Sugars in the 130s.     ROS: See pertinent positives and negatives per HPI.  Past Medical History:  Diagnosis Date  . Allergy   . Arthritis   . Asthma   . Depression   . Diabetes mellitus without complication ([place])   . History of chicken pox     History reviewed. No pertinent surgical history.  Family History  Problem Relation Age of Onset  . Arthritis Mother   . Hyperlipidemia Mother   . Arthritis  Father   . Hypertension Father   . Diabetes Father   . Mental illness Maternal Aunt   . Hyperlipidemia Maternal Aunt   . Alcohol abuse Maternal Aunt   . Mental illness Paternal Aunt   . Alcohol abuse Paternal Aunt   . Arthritis Paternal Uncle   . Arthritis Maternal Grandmother   . Arthritis Paternal Grandmother   . Hyperlipidemia Paternal Grandmother   . Diabetes Paternal Grandmother     SOCIAL HX: reviewed.    Current Outpatient Medications:  .  amoxicillin (AMOXIL) 875 MG tablet, Take 1 tablet (875 mg total) by mouth 2 (two) times daily., Disp: 20 tablet, Rfl: 0 .  empagliflozin (JARDIANCE) 25 MG TABS tablet, Take 25 mg by mouth daily., Disp: 90 tablet, Rfl: 1 .  loratadine (CLARITIN) 10 MG tablet, Take 10 mg by mouth daily., Disp: , Rfl:  .  rosuvastatin (CRESTOR) 20 MG tablet, Take 1 tablet (20 mg total) by mouth daily., Disp: 30 tablet, Rfl: 2  EXAM:  GENERAL: alert, oriented, appears well and in no acute distress  HEENT: atraumatic, conjunttiva clear, no obvious abnormalities on inspection of external nose and ears  NECK: normal movements of the head and neck  LUNGS: on inspection no signs of respiratory distress, breathing rate appears normal, no obvious gross SOB, gasping or wheezing  CV: no obvious cyanosis  MS: moves all visible extremities without noticeable abnormality  PSYCH/NEURO: pleasant and cooperative, no obvious depression or anxiety, speech and thought processing grossly intact  ASSESSMENT AND PLAN:  Discussed the following assessment and plan:  COVID-19 virus detected Recently covid positive.  Has been staying home - quarantined.  No muscle aches now.  Fever - had resolved.  No cough, congestion or sob.  Now with symptoms similar to his previous sinus infections.  Treat with mucinex and amoxicillin.  Follow.  Continue to remain quarantined.  States his work is requiring him to have another test before returning to work - to confirm negative.     Diabetes mellitus Sugars averaging 130s.  Follow met b and a1c.   Sinusitis Symptoms consistent with sinus infection.  Treat with amoxicillin and mucinex as directed.  Follow.      I discussed the assessment and treatment plan with the patient. The patient was provided an opportunity to ask questions and all were answered. The patient agreed with the plan and demonstrated an understanding of the instructions.   The patient was advised to call back or seek an in-person evaluation if the symptoms worsen or if the condition fails to improve as anticipated.   Einar Pheasant, MD

## 2019-05-03 ENCOUNTER — Encounter: Payer: Self-pay | Admitting: Internal Medicine

## 2019-05-03 DIAGNOSIS — J329 Chronic sinusitis, unspecified: Secondary | ICD-10-CM | POA: Insufficient documentation

## 2019-05-03 DIAGNOSIS — J019 Acute sinusitis, unspecified: Secondary | ICD-10-CM | POA: Insufficient documentation

## 2019-05-03 NOTE — Assessment & Plan Note (Signed)
Recently covid positive.  Has been staying home - quarantined.  No muscle aches now.  Fever - had resolved.  No cough, congestion or sob.  Now with symptoms similar to his previous sinus infections.  Treat with mucinex and amoxicillin.  Follow.  Continue to remain quarantined.  States his work is requiring him to have another test before returning to work - to confirm negative.

## 2019-05-03 NOTE — Assessment & Plan Note (Signed)
Symptoms consistent with sinus infection.  Treat with amoxicillin and mucinex as directed.  Follow.

## 2019-05-03 NOTE — Assessment & Plan Note (Signed)
Sugars averaging 130s.  Follow met b and a1c.

## 2019-05-08 ENCOUNTER — Encounter: Payer: Self-pay | Admitting: Internal Medicine

## 2019-05-11 ENCOUNTER — Other Ambulatory Visit: Payer: Self-pay | Admitting: Internal Medicine

## 2019-05-11 NOTE — Telephone Encounter (Signed)
rosuvastatin (CRESTOR) 20 MG tablet   [facility]/[address]   Pt is out of med and it was called in 1wk ago

## 2019-05-12 MED ORDER — ROSUVASTATIN CALCIUM 20 MG PO TABS: 20.0000 mg | ORAL_TABLET | Freq: Every day | ORAL | 2 refills | Status: DC

## 2019-07-03 ENCOUNTER — Encounter: Payer: Self-pay | Admitting: Internal Medicine

## 2019-07-07 ENCOUNTER — Encounter: Payer: Self-pay | Admitting: Internal Medicine

## 2019-07-10 ENCOUNTER — Other Ambulatory Visit: Payer: Self-pay

## 2019-07-10 DIAGNOSIS — E119 Type 2 diabetes mellitus without complications: Secondary | ICD-10-CM

## 2019-07-10 MED ORDER — ROSUVASTATIN CALCIUM 20 MG PO TABS: 20.0000 mg | ORAL_TABLET | Freq: Every day | ORAL | 2 refills | Status: DC

## 2019-07-10 MED ORDER — EMPAGLIFLOZIN 25 MG PO TABS: 25.0000 mg | ORAL_TABLET | Freq: Every day | ORAL | 1 refills | Status: DC

## 2019-07-18 ENCOUNTER — Encounter: Payer: Self-pay | Admitting: Internal Medicine

## 2019-07-31 ENCOUNTER — Other Ambulatory Visit: Payer: Self-pay

## 2019-07-31 ENCOUNTER — Other Ambulatory Visit (INDEPENDENT_AMBULATORY_CARE_PROVIDER_SITE_OTHER): Payer: 59

## 2019-07-31 DIAGNOSIS — E7800 Pure hypercholesterolemia, unspecified: Secondary | ICD-10-CM | POA: Diagnosis not present

## 2019-07-31 DIAGNOSIS — E119 Type 2 diabetes mellitus without complications: Secondary | ICD-10-CM

## 2019-07-31 LAB — HEPATIC FUNCTION PANEL
ALT: 23 U/L (ref 0–53)
AST: 18 U/L (ref 0–37)
Albumin: 4.4 g/dL (ref 3.5–5.2)
Alkaline Phosphatase: 56 U/L (ref 39–117)
Bilirubin, Direct: 0.1 mg/dL (ref 0.0–0.3)
Total Bilirubin: 0.5 mg/dL (ref 0.2–1.2)
Total Protein: 7 g/dL (ref 6.0–8.3)

## 2019-07-31 LAB — BASIC METABOLIC PANEL
BUN: 16 mg/dL (ref 6–23)
CO2: 29 mEq/L (ref 19–32)
Calcium: 9.5 mg/dL (ref 8.4–10.5)
Chloride: 105 mEq/L (ref 96–112)
Creatinine, Ser: 1 mg/dL (ref 0.40–1.50)
GFR: 79.25 mL/min (ref >60.00)
Glucose, Bld: 135 mg/dL — ABNORMAL HIGH (ref 70–99)
Potassium: 4.5 mEq/L (ref 3.5–5.1)
Sodium: 140 mEq/L (ref 135–145)

## 2019-07-31 LAB — LIPID PANEL
Cholesterol: 122 mg/dL (ref 0–200)
HDL: 37.8 mg/dL — ABNORMAL LOW (ref >39.00)
LDL Cholesterol: 69 mg/dL (ref 0–99)
NonHDL: 83.82
Total CHOL/HDL Ratio: 3
Triglycerides: 74 mg/dL (ref 0.0–149.0)
VLDL: 14.8 mg/dL (ref 0.0–40.0)

## 2019-07-31 LAB — MICROALBUMIN / CREATININE URINE RATIO
Creatinine,U: 75.9 mg/dL
Microalb Creat Ratio: 0.9 mg/g (ref 0.0–30.0)
Microalb, Ur: <0.7 mg/dL (ref 0.0–1.9)

## 2019-07-31 LAB — HEMOGLOBIN A1C: Hgb A1c MFr Bld: 7.1 % — ABNORMAL HIGH (ref 4.6–6.5)

## 2019-08-01 ENCOUNTER — Encounter: Payer: Self-pay | Admitting: Internal Medicine

## 2019-08-03 ENCOUNTER — Ambulatory Visit: Payer: 59 | Admitting: Internal Medicine

## 2019-08-03 DIAGNOSIS — E119 Type 2 diabetes mellitus without complications: Secondary | ICD-10-CM

## 2019-08-06 ENCOUNTER — Encounter: Payer: Self-pay | Admitting: Internal Medicine

## 2019-08-06 NOTE — Progress Notes (Signed)
Pt was not evaluated.  Could not connect in with pt. Did not answer his phone.  Multiple attempts.    Dr Nicki Reaper

## 2019-12-02 ENCOUNTER — Encounter: Payer: Self-pay | Admitting: Internal Medicine

## 2019-12-05 ENCOUNTER — Other Ambulatory Visit: Payer: Self-pay

## 2019-12-05 MED ORDER — EMPAGLIFLOZIN 25 MG PO TABS: 25.0000 mg | ORAL_TABLET | Freq: Every day | ORAL | 1 refills | Status: DC

## 2019-12-05 MED ORDER — ROSUVASTATIN CALCIUM 20 MG PO TABS: 20.0000 mg | ORAL_TABLET | Freq: Every day | ORAL | 1 refills | Status: DC

## 2019-12-07 ENCOUNTER — Ambulatory Visit (INDEPENDENT_AMBULATORY_CARE_PROVIDER_SITE_OTHER): Payer: 59 | Admitting: Internal Medicine

## 2019-12-07 ENCOUNTER — Other Ambulatory Visit: Payer: Self-pay

## 2019-12-07 DIAGNOSIS — E119 Type 2 diabetes mellitus without complications: Secondary | ICD-10-CM

## 2019-12-07 DIAGNOSIS — Z9109 Other allergy status, other than to drugs and biological substances: Secondary | ICD-10-CM | POA: Diagnosis not present

## 2019-12-07 DIAGNOSIS — E7800 Pure hypercholesterolemia, unspecified: Secondary | ICD-10-CM

## 2019-12-07 DIAGNOSIS — H9190 Unspecified hearing loss, unspecified ear: Secondary | ICD-10-CM | POA: Diagnosis not present

## 2019-12-07 NOTE — Progress Notes (Signed)
Patient ID: Edward Spencer, male   DOB: [birth year], 49 y.o.   MRN: [id number]   Virtual Visit via video Note  This visit type was conducted due to national recommendations for restrictions regarding the COVID-19 pandemic (e.g. social distancing).  This format is felt to be most appropriate for this patient at this time.  All issues noted in this document were discussed and addressed.  No physical exam was performed (except for noted visual exam findings with Video Visits).   I connected with Charlett Lango by a video enabled telemedicine application and verified that I am speaking with the correct person using two identifiers. Location patient: home Location provider: work Persons participating in the virtual visit: patient, provider  The limitations, risks, security and privacy concerns of performing an evaluation and management service by video and the availability of in person appointments have been discussed.  The patient expressed understanding and agreed to proceed.   Reason for visit: scheduled follow up.   HPI: He reports he is doing well.  Stays active.  No chest pain or sob reported.  Stays physically active.  Has a physical job.  No abdominal pain or bowel change reported.  Sugars sometimes higher in am.  Eats around 5-6 in the evening and then does not eat until later next am.  Discussed eating protein snack prior to bed.  Sugars vary - 128-140 to occasionally 175.  Drinks plenty of water per day. Follows a keto diet plan.  Watches his diet.  Request appt to get his eyes checked.  Also request appt with audiologist to have hearing checked.  Taking crestor and jardiance.     ROS: See pertinent positives and negatives per HPI.  Past Medical History:  Diagnosis Date  . Allergy   . Arthritis   . Asthma   . Depression   . Diabetes mellitus without complication ([place])   . History of chicken pox     History reviewed. No pertinent surgical history.  Family History  Problem  Relation Age of Onset  . Arthritis Mother   . Hyperlipidemia Mother   . Arthritis Father   . Hypertension Father   . Diabetes Father   . Mental illness Maternal Aunt   . Hyperlipidemia Maternal Aunt   . Alcohol abuse Maternal Aunt   . Mental illness Paternal Aunt   . Alcohol abuse Paternal Aunt   . Arthritis Paternal Uncle   . Arthritis Maternal Grandmother   . Arthritis Paternal Grandmother   . Hyperlipidemia Paternal Grandmother   . Diabetes Paternal Grandmother     SOCIAL HX: reviewed.     Current Outpatient Medications:  .  empagliflozin (JARDIANCE) 25 MG TABS tablet, Take 25 mg by mouth daily., Disp: 90 tablet, Rfl: 1 .  loratadine (CLARITIN) 10 MG tablet, Take 10 mg by mouth daily., Disp: , Rfl:  .  rosuvastatin (CRESTOR) 20 MG tablet, Take 1 tablet (20 mg total) by mouth daily., Disp: 90 tablet, Rfl: 1  EXAM:  GENERAL: alert, oriented, appears well and in no acute distress  HEENT: atraumatic, conjunttiva clear, no obvious abnormalities on inspection of external nose and ears  NECK: normal movements of the head and neck  LUNGS: on inspection no signs of respiratory distress, breathing rate appears normal, no obvious gross SOB, gasping or wheezing  CV: no obvious cyanosis  PSYCH/NEURO: pleasant and cooperative, no obvious depression or anxiety, speech and thought processing grossly intact  ASSESSMENT AND PLAN:  Discussed the following assessment and plan:  Diabetes  mellitus On jardiance.  Low carb diet and exercise.  Sugars as outlined.  Follow met b and a1c.  Needs eye exam.  Schedule.    Environmental allergies Controlled.   Hypercholesterolemia On crestor.  Low cholesterol diet and exercise.  Follow lipid panel and liver function tests.    Decreased hearing Decreased hearing.  Refer to ENT for evaluation.     Orders Placed This Encounter  Procedures  . CBC with Differential/Platelet    Standing Status:   Future    Standing Expiration Date:    12/08/2020  . Hepatic function panel    Standing Status:   Future    Standing Expiration Date:   12/08/2020  . Hemoglobin A1c    Standing Status:   Future    Standing Expiration Date:   12/08/2020  . Lipid panel    Standing Status:   Future    Standing Expiration Date:   12/08/2020  . Basic metabolic panel    Standing Status:   Future    Standing Expiration Date:   12/08/2020  . Ambulatory referral to ENT    Referral Priority:   Routine    Referral Type:   Consultation    Referral Reason:   Specialty Services Required    Requested Specialty:   Otolaryngology    Number of Visits Requested:   1  . Ambulatory referral to Ophthalmology    Referral Priority:   Routine    Referral Type:   Consultation    Referral Reason:   Specialty Services Required    Requested Specialty:   Ophthalmology    Number of Visits Requested:   1      I discussed the assessment and treatment plan with the patient. The patient was provided an opportunity to ask questions and all were answered. The patient agreed with the plan and demonstrated an understanding of the instructions.   The patient was advised to call back or seek an in-person evaluation if the symptoms worsen or if the condition fails to improve as anticipated.   Einar Pheasant, MD

## 2019-12-09 ENCOUNTER — Encounter: Payer: Self-pay | Admitting: Internal Medicine

## 2019-12-09 DIAGNOSIS — H9190 Unspecified hearing loss, unspecified ear: Secondary | ICD-10-CM | POA: Insufficient documentation

## 2019-12-09 NOTE — Assessment & Plan Note (Signed)
Controlled.  

## 2019-12-09 NOTE — Assessment & Plan Note (Signed)
On crestor.  Low cholesterol diet and exercise.  Follow lipid panel and liver function tests.   

## 2019-12-09 NOTE — Assessment & Plan Note (Signed)
Decreased hearing.  Refer to ENT for evaluation.  

## 2019-12-09 NOTE — Assessment & Plan Note (Addendum)
On jardiance.  Low carb diet and exercise.  Sugars as outlined.  Follow met b and a1c.  Needs eye exam.  Schedule.

## 2020-03-15 LAB — HM DIABETES EYE EXAM

## 2020-05-24 ENCOUNTER — Other Ambulatory Visit: Payer: Self-pay

## 2020-05-24 ENCOUNTER — Encounter: Payer: Self-pay | Admitting: Internal Medicine

## 2020-05-24 ENCOUNTER — Ambulatory Visit: Payer: No Typology Code available for payment source | Admitting: Internal Medicine

## 2020-05-24 VITALS — BP 118/70 | HR 64 | Temp 97.9°F | Resp 16 | Ht 73.0 in | Wt 240.0 lb

## 2020-05-24 DIAGNOSIS — Z125 Encounter for screening for malignant neoplasm of prostate: Secondary | ICD-10-CM

## 2020-05-24 DIAGNOSIS — Z1211 Encounter for screening for malignant neoplasm of colon: Secondary | ICD-10-CM

## 2020-05-24 DIAGNOSIS — H9190 Unspecified hearing loss, unspecified ear: Secondary | ICD-10-CM

## 2020-05-24 DIAGNOSIS — E7800 Pure hypercholesterolemia, unspecified: Secondary | ICD-10-CM | POA: Diagnosis not present

## 2020-05-24 DIAGNOSIS — R5383 Other fatigue: Secondary | ICD-10-CM

## 2020-05-24 DIAGNOSIS — E119 Type 2 diabetes mellitus without complications: Secondary | ICD-10-CM | POA: Diagnosis not present

## 2020-05-24 NOTE — Progress Notes (Signed)
Patient ID: Edward Spencer, male   DOB: 1971-03-18, 49 y.o.   MRN: [id number]   Subjective:    Patient ID: Edward Spencer, male    DOB: 1970-12-27, 49 y.o.   MRN: [id number]  HPI This visit occurred during the SARS-CoV-2 public health emergency.  Safety protocols were in place, including screening questions prior to the visit, additional usage of staff PPE, and extensive cleaning of exam room while observing appropriate contact time as indicated for disinfecting solutions.  Patient here for a scheduled follow up.  He reports he is doing relatively well.  Does report some increased fatigue.  His job is physically active.  No chest pain or sob with increased activity or exertion.  Works from early am (5:00) and late pm (8:00).  Increased stress with his job.  Planning to start a new job next week. Weight is down from last check. They follow a low carb diet.  No abdominal pain.  Bowels moving.  Persistent tinnitus.  Decreased hearing.  Was evaluated by ENT.  If he is lying on his left side - cannot hear.  Evaluated by ENT.  They felt his hearing loss was related to acoustic trauma from the [employer].  Recommended hearing aids.  Discussed the need for colonoscopy.    Past Medical History:  Diagnosis Date  . Allergy   . Arthritis   . Asthma   . Depression   . Diabetes mellitus without complication ([place])   . History of chicken pox    History reviewed. No pertinent surgical history. Family History  Problem Relation Age of Onset  . Arthritis Mother   . Hyperlipidemia Mother   . Arthritis Father   . Hypertension Father   . Diabetes Father   . Mental illness Maternal Aunt   . Hyperlipidemia Maternal Aunt   . Alcohol abuse Maternal Aunt   . Mental illness Paternal Aunt   . Alcohol abuse Paternal Aunt   . Arthritis Paternal Uncle   . Arthritis Maternal Grandmother   . Arthritis Paternal Grandmother   . Hyperlipidemia Paternal Grandmother   . Diabetes Paternal Grandmother    Social History    Socioeconomic History  . Marital status: Single    Spouse name: Not on file  . Number of children: Not on file  . Years of education: Not on file  . Highest education level: Not on file  Occupational History  . Not on file  Tobacco Use  . Smoking status: Current Every Day Smoker    Types: Cigarettes  . Smokeless tobacco: Never Used  Substance and Sexual Activity  . Alcohol use: Yes    Alcohol/week: 0.0 standard drinks    Comment: social  . Drug use: No  . Sexual activity: Not on file  Other Topics Concern  . Not on file  Social History Narrative  . Not on file   Social Determinants of Health   Financial Resource Strain:   . Difficulty of Paying Living Expenses:   Food Insecurity:   . Worried About Charity fundraiser in the Last Year:   . Arboriculturist in the Last Year:   Transportation Needs:   . Film/video editor (Medical):   Marland Kitchen Lack of Transportation (Non-Medical):   Physical Activity:   . Days of Exercise per Week:   . Minutes of Exercise per Session:   Stress:   . Feeling of Stress :   Social Connections:   . Frequency of Communication with Friends and Family:   .  Frequency of Social Gatherings with Friends and Family:   . Attends Religious Services:   . Active Member of Clubs or Organizations:   . Attends Archivist Meetings:   Marland Kitchen Marital Status:     Outpatient Encounter Medications as of 05/24/2020  Medication Sig  . empagliflozin (JARDIANCE) 25 MG TABS tablet Take 25 mg by mouth daily.  Marland Kitchen loratadine (CLARITIN) 10 MG tablet Take 10 mg by mouth daily.  . rosuvastatin (CRESTOR) 20 MG tablet Take 1 tablet (20 mg total) by mouth daily.   No facility-administered encounter medications on file as of 05/24/2020.    Review of Systems  Constitutional: Negative for appetite change and unexpected weight change.  HENT: Positive for hearing loss. Negative for congestion and sinus pressure.   Respiratory: Negative for cough, chest tightness and  shortness of breath.   Cardiovascular: Negative for chest pain, palpitations and leg swelling.  Gastrointestinal: Negative for abdominal pain, diarrhea, nausea and vomiting.  Genitourinary: Negative for difficulty urinating and dysuria.  Musculoskeletal: Negative for joint swelling and myalgias.  Skin: Negative for color change and rash.  Neurological: Negative for dizziness, light-headedness and headaches.  Psychiatric/Behavioral: Negative for agitation and dysphoric mood.       Objective:    Physical Exam Constitutional:      General: He is not in acute distress.    Appearance: Normal appearance. He is well-developed.  HENT:     Head: Normocephalic and atraumatic.     Right Ear: External ear normal.     Left Ear: External ear normal.  Eyes:     General: No scleral icterus.       Right eye: No discharge.        Left eye: No discharge.     Conjunctiva/sclera: Conjunctivae normal.  Cardiovascular:     Rate and Rhythm: Normal rate and regular rhythm.  Pulmonary:     Effort: Pulmonary effort is normal. No respiratory distress.     Breath sounds: Normal breath sounds.  Abdominal:     General: Bowel sounds are normal.     Palpations: Abdomen is soft.     Tenderness: There is no abdominal tenderness.  Musculoskeletal:        General: No swelling or tenderness.     Cervical back: Neck supple. No tenderness.  Lymphadenopathy:     Cervical: No cervical adenopathy.  Skin:    Findings: No erythema or rash.  Neurological:     Mental Status: He is alert.  Psychiatric:        Mood and Affect: Mood normal.        Behavior: Behavior normal.     BP 118/70   Pulse 64   Temp 97.9 F (36.6 C)   Resp 16   Ht _0  (1.854 m)   Wt 240 lb (108.9 kg)   SpO2 97%   BMI 31.66 kg/m  Wt Readings from Last 3 Encounters:  05/24/20 240 lb (108.9 kg)  02/02/19 252 lb (114.3 kg)  12/22/18 255 lb 9.6 oz (115.9 kg)     Lab Results  Component Value Date   WBC 6.2 12/30/2018   HGB 15.2  12/30/2018   HCT 44.4 12/30/2018   PLT 165.0 12/30/2018   GLUCOSE 135 (H) 07/31/2019   CHOL 122 07/31/2019   TRIG 74.0 07/31/2019   HDL 37.80 (L) 07/31/2019   LDLCALC 69 07/31/2019   ALT 23 07/31/2019   AST 18 07/31/2019   NA 140 07/31/2019   K 4.5 07/31/2019  CL 105 07/31/2019   CREATININE 1.00 07/31/2019   BUN 16 07/31/2019   CO2 29 07/31/2019   TSH 0.83 12/30/2018   PSA 0.45 08/09/2015   HGBA1C 7.1 (H) 07/31/2019   MICROALBUR <0.7 07/31/2019       Assessment & Plan:   Problem List Items Addressed This Visit    Colon cancer screening    Discussed the need for colon cancer screening.  Will notify me when agreeable for referral for colonoscopy.        Decreased hearing    Hearing loss as outlined.  Tinnitus.  Saw ENT.  Felt his decreased hearing was related to acoustic trauma from his [place] experience.  Recommended hearing aids.  He is going to see about hearing aids through [place].        Diabetes mellitus ([place])    On jardiance.  Continue low carb diet and exercise.  Not checking sugars.  Follow met b and a1c.       Relevant Orders   Hemoglobin N6E   Basic metabolic panel   Fatigue    Discussed possible etiologies with him.  He does not snore.  Feels rested when wakes up.  Does work long hours.  Physical job.  Check routine labs including cbc, met c and tsh.  Follow.        Relevant Orders   CBC with Differential/Platelet   TSH   Hypercholesterolemia    On crestor.  Low cholesterol diet and exercise.  Follow lipid panel and liver function tests.        Relevant Orders   Hepatic function panel   Lipid panel    Other Visit Diagnoses    Prostate cancer screening    -  Primary   Relevant Orders   PSA       Einar Pheasant, MD

## 2020-05-25 ENCOUNTER — Encounter: Payer: Self-pay | Admitting: Internal Medicine

## 2020-05-25 DIAGNOSIS — Z1211 Encounter for screening for malignant neoplasm of colon: Secondary | ICD-10-CM | POA: Insufficient documentation

## 2020-05-25 DIAGNOSIS — R5383 Other fatigue: Secondary | ICD-10-CM | POA: Insufficient documentation

## 2020-05-25 NOTE — Assessment & Plan Note (Signed)
Discussed possible etiologies with him.  He does not snore.  Feels rested when wakes up.  Does work long hours.  Physical job.  Check routine labs including cbc, met c and tsh.  Follow.

## 2020-05-25 NOTE — Assessment & Plan Note (Signed)
Hearing loss as outlined.  Tinnitus.  Saw ENT.  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  Recommended hearing aids.  He is going to see about hearing aids through [place].

## 2020-05-25 NOTE — Assessment & Plan Note (Signed)
Discussed the need for colon cancer screening.  Will notify me when agreeable for referral for colonoscopy.

## 2020-05-25 NOTE — Assessment & Plan Note (Signed)
On crestor.  Low cholesterol diet and exercise.  Follow lipid panel and liver function tests.   

## 2020-05-25 NOTE — Assessment & Plan Note (Signed)
On jardiance.  Continue low carb diet and exercise.  Not checking sugars.  Follow met b and a1c.

## 2020-05-26 ENCOUNTER — Encounter: Payer: Self-pay | Admitting: Internal Medicine

## 2020-05-26 ENCOUNTER — Other Ambulatory Visit: Payer: Self-pay | Admitting: Internal Medicine

## 2020-05-27 ENCOUNTER — Other Ambulatory Visit: Payer: Self-pay

## 2020-05-27 MED ORDER — EMPAGLIFLOZIN 25 MG PO TABS: 25.0000 mg | ORAL_TABLET | Freq: Every day | ORAL | 1 refills | Status: DC

## 2020-05-27 MED ORDER — ROSUVASTATIN CALCIUM 20 MG PO TABS: 20.0000 mg | ORAL_TABLET | Freq: Every day | ORAL | 1 refills | Status: DC

## 2020-07-15 ENCOUNTER — Encounter: Payer: Self-pay | Admitting: Internal Medicine

## 2020-07-16 ENCOUNTER — Encounter: Payer: Self-pay | Admitting: Nurse Practitioner

## 2020-07-16 ENCOUNTER — Telehealth (INDEPENDENT_AMBULATORY_CARE_PROVIDER_SITE_OTHER): Payer: Self-pay | Admitting: Nurse Practitioner

## 2020-07-16 ENCOUNTER — Other Ambulatory Visit: Payer: Self-pay

## 2020-07-16 VITALS — Temp 98.3°F | Ht 73.0 in | Wt 240.0 lb

## 2020-07-16 DIAGNOSIS — J069 Acute upper respiratory infection, unspecified: Secondary | ICD-10-CM

## 2020-07-16 NOTE — Progress Notes (Signed)
Virtual Visit via Virtual Note  This visit type was conducted due to national recommendations for restrictions regarding the COVID-19 pandemic (e.g. social distancing).  This format is felt to be most appropriate for this patient at this time.  All issues noted in this document were discussed and addressed.  No physical exam was performed (except for noted visual exam findings with Video Visits).   I connected with@ on 07/17/20 at  4:30 PM EDT by a video enabled telemedicine application or telephone and verified that I am speaking with the correct person using two identifiers. Location patient: home Location provider: work or home office Persons participating in the virtual visit: patient, provider  I discussed the limitations, risks, security and privacy concerns of performing an evaluation and management service by telephone and the availability of in person appointments. I also discussed with the patient that there may be a patient responsible charge related to this service. The patient expressed understanding and agreed to proceed.    Reason for visit: Sinus congestion started Friday.  Covid test negative.  Feeling better today.  HPI: This healthy 49 year old reports that he had some sinus congestion on Friday.   He has some pressure above his eyes.  Has had slight cough with this.  He has been blowing out clear mucus.  The symptoms have all improved over the last few days.  He thinks he will get over it without antibiotics. He has been taking Mucinex DM, cough and cold medicine.  He does have allergies and alternates Claritin with Zyrtec.  He has not been using his Flonase.  He has noted no fevers or chills.  Temp is 98.  He is a smoker 1 pack a day every 2 to 3 days.  Negative Covid test Friday.  ROS: See pertinent positives and negatives per HPI.  Past Medical History:  Diagnosis Date  . Allergy   . Arthritis   . Asthma   . Depression   . Diabetes mellitus without complication ([facility])    . History of chicken pox     No past surgical history on file.  Family History  Problem Relation Age of Onset  . Arthritis Mother   . Hyperlipidemia Mother   . Arthritis Father   . Hypertension Father   . Diabetes Father   . Mental illness Maternal Aunt   . Hyperlipidemia Maternal Aunt   . Alcohol abuse Maternal Aunt   . Mental illness Paternal Aunt   . Alcohol abuse Paternal Aunt   . Arthritis Paternal Uncle   . Arthritis Maternal Grandmother   . Arthritis Paternal Grandmother   . Hyperlipidemia Paternal Grandmother   . Diabetes Paternal Grandmother     SOCIAL HX: smoker   Current Outpatient Medications:  .  empagliflozin (JARDIANCE) 25 MG TABS tablet, Take 1 tablet (25 mg total) by mouth daily., Disp: 90 tablet, Rfl: 1 .  loratadine (CLARITIN) 10 MG tablet, Take 10 mg by mouth daily., Disp: , Rfl:  .  rosuvastatin (CRESTOR) 20 MG tablet, Take 1 tablet (20 mg total) by mouth daily., Disp: 90 tablet, Rfl: 1  EXAM:  VITALS per patient if applicable:  GENERAL: alert, oriented, appears well and in no acute distress  HEENT: atraumatic, conjunctiva clear, no obvious abnormalities on inspection of external nose and ears  NECK: normal movements of the head and neck  LUNGS: on inspection no signs of respiratory distress, breathing rate appears normal, no obvious gross SOB, gasping or wheezing  CV: no obvious cyanosis  MS:  moves all visible extremities without noticeable abnormality  PSYCH/NEURO: pleasant and cooperative, no obvious depression or anxiety, speech and thought processing grossly intact  ASSESSMENT AND PLAN:  Discussed the following assessment and plan:  Viral upper respiratory tract infection  No problem-specific Assessment & Plan notes found for this encounter.  Your URI seems to be improving is likely viral in nature. Your Covid was negative.   Simply Saline nasal spray.   Use your Flonase as directed.   Use your Mucinex as you are doing.    Call back if symptoms worsen.    I discussed the assessment and treatment plan with the patient. The patient was provided an opportunity to ask questions and all were answered. The patient agreed with the plan and demonstrated an understanding of the instructions.   The patient was advised to call back or seek an in-person evaluation if the symptoms worsen or if the condition fails to improve as anticipated.  I provided 7  minutes of non-face-to-face time during this encounter. Amedeo Kinsman, NP Adult Nurse Practitioner [facility] [employer] [phone]

## 2020-07-16 NOTE — Patient Instructions (Addendum)
Your URI seems to be improving is likely viral in nature. Your Covid was negative.   Simply Saline nasal spray.   Use your Flonase as directed.   Use your Mucinex as you are doing.   Call back if symptoms worsen.     Upper Respiratory Infection, Adult An upper respiratory infection (URI) is a common viral infection of the nose, throat, and upper air passages that lead to the lungs. The most common type of URI is the common cold. URIs usually get better on their own, without medical treatment. What are the causes? A URI is caused by a virus. You may catch a virus by:  Breathing in droplets from an infected person's cough or sneeze.  Touching something that has been exposed to the virus (contaminated) and then touching your mouth, nose, or eyes. What increases the risk? You are more likely to get a URI if:  You are very young or very old.  It is autumn or winter.  You have close contact with others, such as at a daycare, school, or health care facility.  You smoke.  You have long-term (chronic) heart or lung disease.  You have a weakened disease-fighting (immune) system.  You have nasal allergies or asthma.  You are experiencing a lot of stress.  You work in an area that has poor air circulation.  You have poor nutrition. What are the signs or symptoms? A URI usually involves some of the following symptoms:  Runny or stuffy (congested) nose.  Sneezing.  Cough.  Sore throat.  Headache.  Fatigue.  Fever.  Loss of appetite.  Pain in your forehead, behind your eyes, and over your cheekbones (sinus pain).  Muscle aches.  Redness or irritation of the eyes.  Pressure in the ears or face. How is this diagnosed? This condition may be diagnosed based on your medical history and symptoms, and a physical exam. Your health care provider may use a cotton swab to take a mucus sample from your nose (nasal swab). This sample can be tested to determine what virus is  causing the illness. How is this treated? URIs usually get better on their own within 7-10 days. You can take steps at home to relieve your symptoms. Medicines cannot cure URIs, but your health care provider may recommend certain medicines to help relieve symptoms, such as:  Over-the-counter cold medicines.  Cough suppressants. Coughing is a type of defense against infection that helps to clear the respiratory system, so take these medicines only as recommended by your health care provider.  Fever-reducing medicines. Follow these instructions at home: Activity  Rest as needed.  If you have a fever, stay home from work or school until your fever is gone or until your health care provider says you are no longer contagious. Your health care provider may have you wear a face mask to prevent your infection from spreading. Relieving symptoms  Gargle with a salt-water mixture 3-4 times a day or as needed. To make a salt-water mixture, completely dissolve -1 tsp of salt in 1 cup of warm water.  Use a cool-mist humidifier to add moisture to the air. This can help you breathe more easily. Eating and drinking   Drink enough fluid to keep your urine pale yellow.  Eat soups and other clear broths. General instructions   Take over-the-counter and prescription medicines only as told by your health care provider. These include cold medicines, fever reducers, and cough suppressants.  Do not use any products that contain  nicotine or tobacco, such as cigarettes and e-cigarettes. If you need help quitting, ask your health care provider.  Stay away from secondhand smoke.  Stay up to date on all immunizations, including the yearly (annual) flu vaccine.  Keep all follow-up visits as told by your health care provider. This is important. How to prevent the spread of infection to others   URIs can be passed from person to person (are contagious). To prevent the infection from spreading: ? Wash your  hands often with soap and water. If soap and water are not available, use hand sanitizer. ? Avoid touching your mouth, face, eyes, or nose. ? Cough or sneeze into a tissue or your sleeve or elbow instead of into your hand or into the air. Contact a health care provider if:  You are getting worse instead of better.  You have a fever or chills.  Your mucus is brown or red.  You have yellow or brown discharge coming from your nose.  You have pain in your face, especially when you bend forward.  You have swollen neck glands.  You have pain while swallowing.  You have white areas in the back of your throat. Get help right away if:  You have shortness of breath that gets worse.  You have severe or persistent: ? Headache. ? Ear pain. ? Sinus pain. ? Chest pain.  You have chronic lung disease along with any of the following: ? Wheezing. ? Prolonged cough. ? Coughing up blood. ? A change in your usual mucus.  You have a stiff neck.  You have changes in your: ? Vision. ? Hearing. ? Thinking. ? Mood. Summary  An upper respiratory infection (URI) is a common infection of the nose, throat, and upper air passages that lead to the lungs.  A URI is caused by a virus.  URIs usually get better on their own within 7-10 days.  Medicines cannot cure URIs, but your health care provider may recommend certain medicines to help relieve symptoms. This information is not intended to replace advice given to you by your health care provider. Make sure you discuss any questions you have with your health care provider. Document Revised: 11/10/2018 Document Reviewed: 06/18/2017 Elsevier Patient Education  2020 [employer].

## 2020-07-16 NOTE — Telephone Encounter (Signed)
Scheduled with kim

## 2020-07-17 ENCOUNTER — Encounter: Payer: Self-pay | Admitting: Nurse Practitioner

## 2020-08-04 ENCOUNTER — Other Ambulatory Visit: Payer: Self-pay | Admitting: Internal Medicine

## 2020-08-18 ENCOUNTER — Encounter: Payer: Self-pay | Admitting: Internal Medicine

## 2020-09-10 NOTE — Telephone Encounter (Signed)
Approvedon October 25 [place] Case: [id number], Status: Approved, Coverage Starts on: 09/09/2020 12:00:00 AM, Coverage Ends on: 09/09/2021 12:00:00 AM. Patient aware of approval.

## 2020-09-19 ENCOUNTER — Other Ambulatory Visit (INDEPENDENT_AMBULATORY_CARE_PROVIDER_SITE_OTHER): Payer: BC Managed Care – PPO

## 2020-09-19 ENCOUNTER — Other Ambulatory Visit: Payer: Self-pay

## 2020-09-19 DIAGNOSIS — Z125 Encounter for screening for malignant neoplasm of prostate: Secondary | ICD-10-CM

## 2020-09-19 DIAGNOSIS — E119 Type 2 diabetes mellitus without complications: Secondary | ICD-10-CM

## 2020-09-19 DIAGNOSIS — R5383 Other fatigue: Secondary | ICD-10-CM

## 2020-09-19 DIAGNOSIS — E7800 Pure hypercholesterolemia, unspecified: Secondary | ICD-10-CM

## 2020-09-19 LAB — HEPATIC FUNCTION PANEL
ALT: 21 U/L (ref 0–53)
AST: 13 U/L (ref 0–37)
Albumin: 4.6 g/dL (ref 3.5–5.2)
Alkaline Phosphatase: 77 U/L (ref 39–117)
Bilirubin, Direct: 0.1 mg/dL (ref 0.0–0.3)
Total Bilirubin: 0.6 mg/dL (ref 0.2–1.2)
Total Protein: 7.1 g/dL (ref 6.0–8.3)

## 2020-09-19 LAB — LIPID PANEL
Cholesterol: 144 mg/dL (ref 0–200)
HDL: 33.6 mg/dL — ABNORMAL LOW (ref >39.00)
LDL Cholesterol: 77 mg/dL (ref 0–99)
NonHDL: 110.07
Total CHOL/HDL Ratio: 4
Triglycerides: 166 mg/dL — ABNORMAL HIGH (ref 0.0–149.0)
VLDL: 33.2 mg/dL (ref 0.0–40.0)

## 2020-09-19 LAB — BASIC METABOLIC PANEL
BUN: 16 mg/dL (ref 6–23)
CO2: 28 mEq/L (ref 19–32)
Calcium: 9.4 mg/dL (ref 8.4–10.5)
Chloride: 103 mEq/L (ref 96–112)
Creatinine, Ser: 1.04 mg/dL (ref 0.40–1.50)
GFR: 83.63 mL/min (ref >60.00)
Glucose, Bld: 166 mg/dL — ABNORMAL HIGH (ref 70–99)
Potassium: 4.2 mEq/L (ref 3.5–5.1)
Sodium: 139 mEq/L (ref 135–145)

## 2020-09-19 LAB — CBC WITH DIFFERENTIAL/PLATELET
Basophils Absolute: 0 10*3/uL (ref 0.0–0.1)
Basophils Relative: 0.8 % (ref 0.0–3.0)
Eosinophils Absolute: 0.2 10*3/uL (ref 0.0–0.7)
Eosinophils Relative: 3.4 % (ref 0.0–5.0)
HCT: 45 % (ref 39.0–52.0)
Hemoglobin: 15.4 g/dL (ref 13.0–17.0)
Lymphocytes Relative: 33.2 % (ref 12.0–46.0)
Lymphs Abs: 1.7 10*3/uL (ref 0.7–4.0)
MCHC: 34.2 g/dL (ref 30.0–36.0)
MCV: 90.8 fl (ref 78.0–100.0)
Monocytes Absolute: 0.5 10*3/uL (ref 0.1–1.0)
Monocytes Relative: 9.5 % (ref 3.0–12.0)
Neutro Abs: 2.7 10*3/uL (ref 1.4–7.7)
Neutrophils Relative %: 53.1 % (ref 43.0–77.0)
Platelets: 147 10*3/uL — ABNORMAL LOW (ref 150.0–400.0)
RBC: 4.95 Mil/uL (ref 4.22–5.81)
RDW: 13.1 % (ref 11.5–15.5)
WBC: 5.1 10*3/uL (ref 4.0–10.5)

## 2020-09-19 LAB — HEMOGLOBIN A1C: Hgb A1c MFr Bld: 8.2 % — ABNORMAL HIGH (ref 4.6–6.5)

## 2020-09-19 LAB — PSA: PSA: 0.3 ng/mL (ref 0.10–4.00)

## 2020-09-19 LAB — TSH: TSH: 0.83 u[IU]/mL (ref 0.35–4.50)

## 2020-09-24 ENCOUNTER — Encounter: Payer: No Typology Code available for payment source | Admitting: Internal Medicine

## 2020-09-24 DIAGNOSIS — Z0289 Encounter for other administrative examinations: Secondary | ICD-10-CM

## 2020-10-07 DIAGNOSIS — H90A22 Sensorineural hearing loss, unilateral, left ear, with restricted hearing on the contralateral side: Secondary | ICD-10-CM | POA: Diagnosis not present

## 2020-10-07 DIAGNOSIS — H903 Sensorineural hearing loss, bilateral: Secondary | ICD-10-CM | POA: Diagnosis not present

## 2020-11-13 ENCOUNTER — Telehealth: Payer: Self-pay | Admitting: Internal Medicine

## 2020-11-13 DIAGNOSIS — E1165 Type 2 diabetes mellitus with hyperglycemia: Secondary | ICD-10-CM

## 2020-11-13 NOTE — Telephone Encounter (Signed)
This is the pt that I sent you the my chart message.  Bayard Males) sent message regarding his jardiance.  He did not keep his last appt with me to discuss his a1c 8.2.  Can you help with this.  See her message (cost of [place] is an issue).  Let me know what you need me to do.  I can place order for official referral.    Thank you.  Dale Drake

## 2020-11-13 NOTE — Telephone Encounter (Addendum)
I'd be happy to help.   Can you call patient to direct to download the Jardiance savings card on the manufacturer website? This can be used in combination with his BCBS insurance to reduce cost of medication. Should be more effective than a Singlecard coupon without his insurance   [email or web address]

## 2020-11-13 NOTE — Telephone Encounter (Signed)
Spoke with patient about medication issue. Patient had changed insurances and now medication is $300 a month and he can not afford that. I gave patient the website to go to to get discount card. Patient is going to try this option and will call back if there are any issues. I went to the website and looks like there is a $10 discount card patient can get.

## 2020-11-14 ENCOUNTER — Telehealth: Payer: Self-pay | Admitting: *Deleted

## 2020-11-14 NOTE — Chronic Care Management (AMB) (Signed)
  Care Management   Note  11/14/2020 Name: JAGO CARTON MRN: [id number] DOB: 1971-09-22  RESHAWN OSTLUND is a 49 y.o. year old male who is a primary care patient of Dale Westgate, MD. I reached out to Clarene Critchley by phone today in response to a referral sent by Mr. Corney Knighton Moltz's PCP, Dale , MD.    Mr. Nakamura was given information about care management services today including:  1. Care management services include personalized support from designated clinical staff supervised by his physician, including individualized plan of care and coordination with other care providers 2. 24/7 contact phone numbers for assistance for urgent and routine care needs. 3. The patient may stop care management services at any time by phone call to the office staff.  Patient agreed to services and verbal consent obtained.   Follow up plan: Telephone appointment with care management team member scheduled for:12/11/2020  [facility] Guide, Embedded Care Coordination [facility]  Care Management  Direct Dial: [phone]

## 2020-11-21 ENCOUNTER — Other Ambulatory Visit: Payer: Self-pay | Admitting: Internal Medicine

## 2020-12-11 ENCOUNTER — Ambulatory Visit: Payer: BC Managed Care – PPO | Admitting: Pharmacist

## 2020-12-11 DIAGNOSIS — E7800 Pure hypercholesterolemia, unspecified: Secondary | ICD-10-CM

## 2020-12-11 DIAGNOSIS — E1165 Type 2 diabetes mellitus with hyperglycemia: Secondary | ICD-10-CM

## 2020-12-11 NOTE — Patient Instructions (Signed)
Visit Information  Patient Care Plan: Medication Management    Problem Identified: Diabetes, HLD     Long-Range Goal: Disease Progression Prevention   Start Date: 12/11/2020  This Visit's Progress: On track  Priority: High  Note:   Current Barriers:  . Unable to independently afford treatment regimen . Unable to achieve control of diabetes   Pharmacist Clinical Goal(s):  Marland Kitchen Over the next 90 days, patient will achieve control of diabetes as evidenced by A1c  through collaboration with PharmD and provider.   Interventions: . 1:1 collaboration with Einar Pheasant, MD regarding development and update of comprehensive plan of care as evidenced by provider attestation and co-signature . Inter-disciplinary care team collaboration (see longitudinal plan of care) . Comprehensive medication review performed; medication list updated in electronic medical record  Diabetes: . Uncontrolled; current treatment: Jardiance 25 mg daily - reports that since he started on a new insurance plan in December, cost as been >$400.  o Hx metformin: he reports today it was d/c d/t GI upset, documentation in chart also notes that he was concerned about renal damage . Current glucose readings: not checking at this time . Discussed mechanisms and long term benefits of metformin, SGLT2, and GLP1 . Educated on goal A1c, goal fasting, and goal 2 hour post prandial glucose. Encouraged to restart checking at home to determine current control on Jardiance therapy (he notes he had not been taking Jardiance prior to last A1c) . Contacted pharmacy. Confirmed that Vania Rea is covered under his insurance plan, but cost is >$500 - appears he has a deductible that must be met. Patient is unsure if it is a separate medical vs pharmacy or combined medical and pharmacy deductible. Advised he contact his insurance to clarify. Reviewed that Jardiance savings card max per month is $150, hence why the copay is still high with insurance +  savings card.  . Discussed options: d/t Jardiance and retrial of metformin XR with slow titration vs d/c Jardiance and change to [employer], as [employer] savings card has a max monthly value of $583, which will apply towards his deductible and should bring copay to $0. He declines to make any changes at this time, would like to discuss options with PCP first. Will collaborate w/ office staff to outreach patient for scheduling.  . Continue Jardiance 25 mg daily at this time . Discussed that GLP1 agents would not be any more affordable, as they would very likely be covered similarly to Jardiance and also have a high copay that goes towards deductible. He verbalized understanding.   Hyperlipidemia: . At goal LDL <100; current treatment: rosuvastatin 20 mg daily . Due for refill. Refill sent today.   . Recommended to continue current therapy at this time.  Given diabetes + risk factor of tobacco use, could consider more stringent LDL goal <70  Patient Goals/Self-Care Activities . Over the next 90 days, patient will:  - take medications as prescribed check glucose daily, document, and provide at future appointments  Follow Up Plan: Telephone follow up appointment with care management team member scheduled for: ~ 12 weeks (patient plans to see PCP in between now and then)     Mr. Perz was given information about Care Management services today including:  1. Care Management services include personalized support from designated clinical staff supervised by his physician, including individualized plan of care and coordination with other care providers 2. 24/7 contact phone numbers for assistance for urgent and routine care needs. 3. The patient may stop CCM  services at any time (effective at the end of the month) by phone call to the office staff.  Patient agreed to services and verbal consent obtained.   Patient verbalizes understanding of instructions provided today and agrees to view in [place].    Plan: Telephone follow up appointment with care management team member scheduled for:  ~ 12 weeks per patient request  Catie Darnelle Maffucci, PharmD, [place], [place] Clinical Pharmacist [employer] at [facility] [phone]

## 2020-12-11 NOTE — Chronic Care Management (AMB) (Signed)
Care Management   Pharmacy Note  12/11/2020 Name: Edward Spencer MRN: [id number] DOB: 1971-02-19  Subjective: Edward Spencer is a 50 y.o. year old male who is a primary care patient of Einar Pheasant, MD. The Care Management team was consulted for assistance with care management and care coordination needs.    Engaged with patient by telephone for initial visit in response to provider referral for pharmacy case management and/or care coordination services.   The patient was given information about Care Management services today including:  1. Care Management services includes personalized support from designated clinical staff supervised by the patient's primary care provider, including individualized plan of care and coordination with other care providers. 2. 24/7 contact phone numbers for assistance for urgent and routine care needs. 3. The patient may stop case management services at any time by phone call to the office staff.  Patient agreed to services and consent obtained.  Assessment:  Review of patient status, including review of consultants reports, laboratory and other test data, was performed as part of comprehensive evaluation and provision of chronic care management services.   SDOH (Social Determinants of Health) assessments and interventions performed:  SDOH Interventions   Flowsheet Row Most Recent Value  SDOH Interventions   Financial Strain Interventions Other (Comment)  [medication access evaluation]       Objective:  Lab Results  Component Value Date   CREATININE 1.04 09/19/2020   CREATININE 1.00 07/31/2019   CREATININE 1.16 12/30/2018    Lab Results  Component Value Date   HGBA1C 8.2 (H) 09/19/2020       Component Value Date/Time   CHOL 144 09/19/2020 0808   TRIG 166.0 (H) 09/19/2020 0808   HDL 33.60 (L) 09/19/2020 0808   CHOLHDL 4 09/19/2020 0808   VLDL 33.2 09/19/2020 0808   LDLCALC 77 09/19/2020 0808   LDLCALC 151 (H) 12/30/2018 0846     Clinical ASCVD: No  The 10-year ASCVD risk score Mikey Bussing DC Jr., et al., 2013) is: 12%   Values used to calculate the score:     Age: 43 years     Sex: Male     Is Non-Hispanic African American: No     Diabetic: Yes     Tobacco smoker: Yes     Systolic Blood Pressure: 914 mmHg     Is BP treated: No     HDL Cholesterol: 33.6 mg/dL     Total Cholesterol: 144 mg/dL     BP Readings from Last 3 Encounters:  05/24/20 118/70  02/02/19 112/72  12/22/18 112/64    Care Plan  No Known Allergies  Medications Reviewed Today    Reviewed by De Hollingshead, RPH-CPP (Pharmacist) on 12/11/20 at 1305  Med List Status: <None>  Medication Order Taking? Sig Documenting Provider Last Dose Status Informant  JARDIANCE 25 MG TABS tablet [id number] Yes TAKE 1 TABLET BY MOUTH DAILY Einar Pheasant, MD Taking Active   loratadine (CLARITIN) 10 MG tablet [id number] Yes Take 10 mg by mouth daily. [provider] Taking Active Self  rosuvastatin (CRESTOR) 20 MG tablet [id number] Yes Take 1 tablet (20 mg total) by mouth daily. Einar Pheasant, MD Taking Active   vitamin B-12 (CYANOCOBALAMIN) 100 MCG tablet [id number] Yes Take 100 mcg by mouth daily. [provider] Taking Active           Patient Active Problem List   Diagnosis Date Noted  . Fatigue 05/25/2020  . Colon cancer screening 05/25/2020  . Decreased hearing 12/09/2019  .  Sinusitis 05/03/2019  . COVID-19 virus detected 04/23/2019  . Hypercholesterolemia 02/05/2019  . Headache 02/02/2016  . Health care maintenance 09/29/2015  . Lateral epicondylitis 08/19/2015  . Right elbow pain 08/11/2015  . Left shoulder pain 08/11/2015  . Environmental allergies 08/11/2015  . Diabetes mellitus ([place]) 08/09/2015    Conditions to be addressed/monitored: HLD and DMII  Care Plan : Medication Management  Updates made by De Hollingshead, RPH-CPP since 12/11/2020 12:00 AM    Problem: Diabetes, HLD     Long-Range Goal: Disease  Progression Prevention   Start Date: 12/11/2020  This Visit's Progress: On track  Priority: High  Note:   Current Barriers:  . Unable to independently afford treatment regimen . Unable to achieve control of diabetes   Pharmacist Clinical Goal(s):  Marland Kitchen Over the next 90 days, patient will achieve control of diabetes as evidenced by A1c  through collaboration with PharmD and provider.   Interventions: . 1:1 collaboration with Einar Pheasant, MD regarding development and update of comprehensive plan of care as evidenced by provider attestation and co-signature . Inter-disciplinary care team collaboration (see longitudinal plan of care) . Comprehensive medication review performed; medication list updated in electronic medical record  Diabetes: . Uncontrolled; current treatment: Jardiance 25 mg daily - reports that since he started on a new insurance plan in December, cost as been >$400.  o Hx metformin: he reports today it was d/c d/t GI upset, documentation in chart also notes that he was concerned about renal damage . Current glucose readings: not checking at this time . Discussed mechanisms and long term benefits of metformin, SGLT2, and GLP1 . Educated on goal A1c, goal fasting, and goal 2 hour post prandial glucose. Encouraged to restart checking at home to determine current control on Jardiance therapy (he notes he had not been taking Jardiance prior to last A1c) . Contacted pharmacy. Confirmed that Vania Rea is covered under his insurance plan, but cost is >$500 - appears he has a deductible that must be met. Patient is unsure if it is a separate medical vs pharmacy or combined medical and pharmacy deductible. Advised he contact his insurance to clarify. Reviewed that Jardiance savings card max per month is $150, hence why the copay is still high with insurance + savings card.  . Discussed options: d/t Jardiance and retrial of metformin XR with slow titration vs d/c Jardiance and change to  [employer], as [employer] savings card has a max monthly value of $583, which will apply towards his deductible and should bring copay to $0. He declines to make any changes at this time, would like to discuss options with PCP first. Will collaborate w/ office staff to outreach patient for scheduling.  . Continue Jardiance 25 mg daily at this time . Discussed that GLP1 agents would not be any more affordable, as they would very likely be covered similarly to Jardiance and also have a high copay that goes towards deductible. He verbalized understanding.   Hyperlipidemia: . At goal LDL <100; current treatment: rosuvastatin 20 mg daily . Due for refill. Refill sent today.   . Recommended to continue current therapy at this time.  Given diabetes + risk factor of tobacco use, could consider more stringent LDL goal <70  Patient Goals/Self-Care Activities . Over the next 90 days, patient will:  - take medications as prescribed check glucose daily, document, and provide at future appointments  Follow Up Plan: Telephone follow up appointment with care management team member scheduled for: ~ 12 weeks (patient  plans to see PCP in between now and then)     Medication Assistance:  Discussed options today, see above  Follow Up:  Patient agrees to Care Plan and Follow-up.  Plan: Telephone follow up appointment with care management team member scheduled for:  ~ 12 weeks per patient request  Catie Darnelle Maffucci, PharmD, [place], [place] Clinical Pharmacist [employer] at [facility] [phone]

## 2021-01-22 ENCOUNTER — Other Ambulatory Visit: Payer: Self-pay | Admitting: Internal Medicine

## 2021-01-23 ENCOUNTER — Ambulatory Visit: Payer: BC Managed Care – PPO | Admitting: Pharmacist

## 2021-01-23 DIAGNOSIS — E7800 Pure hypercholesterolemia, unspecified: Secondary | ICD-10-CM

## 2021-01-23 DIAGNOSIS — E1165 Type 2 diabetes mellitus with hyperglycemia: Secondary | ICD-10-CM

## 2021-01-23 MED ORDER — ROSUVASTATIN CALCIUM 20 MG PO TABS: 20.0000 mg | ORAL_TABLET | Freq: Every day | ORAL | 3 refills | Status: DC

## 2021-01-23 NOTE — Patient Instructions (Signed)
Visit Information  Goals Addressed              This Visit's Progress     Patient Stated   .  Medication Monitoring (pt-stated)        Patient Goals/Self-Care Activities . Over the next 90 days, patient will:  - take medications as prescribed check glucose daily, document, and provide at future appointments        Patient verbalizes understanding of instructions provided today and agrees to view in MyChart.   Plan: Telephone follow up appointment with care management team member scheduled for:  ~ 8 weeks as previously scheduled  [name], PharmD, [place], CPP Clinical Pharmacist [employer] at [employer] [phone]

## 2021-01-23 NOTE — Chronic Care Management (AMB) (Signed)
Care Management   Pharmacy Note  01/23/2021 Name: Edward Spencer MRN: [id number] DOB: 09-01-1971  Subjective: Edward Spencer is a 50 y.o. year old male who is a primary care patient of Dale [place], MD. The Care Management team was consulted for assistance with care management and care coordination needs.    Engaged with patient by telephone for response to a call about medication refills in response to provider referral for pharmacy case management and/or care coordination services.   The patient was given information about Care Management services today including:  1. Care Management services includes personalized support from designated clinical staff supervised by the patient's primary care provider, including individualized plan of care and coordination with other care providers. 2. 24/7 contact phone numbers for assistance for urgent and routine care needs. 3. The patient may stop case management services at any time by phone call to the office staff.  Patient agreed to services and consent obtained.  Assessment:  Review of patient status, including review of consultants reports, laboratory and other test data, was performed as part of comprehensive evaluation and provision of chronic care management services.   SDOH (Social Determinants of Health) assessments and interventions performed:  SDOH Interventions   Flowsheet Row Most Recent Value  SDOH Interventions   Financial Strain Interventions Intervention Not Indicated       Objective:  Lab Results  Component Value Date   CREATININE 1.04 09/19/2020   CREATININE 1.00 07/31/2019   CREATININE 1.16 12/30/2018    Lab Results  Component Value Date   HGBA1C 8.2 (H) 09/19/2020       Component Value Date/Time   CHOL 144 09/19/2020 0808   TRIG 166.0 (H) 09/19/2020 0808   HDL 33.60 (L) 09/19/2020 0808   CHOLHDL 4 09/19/2020 0808   VLDL 33.2 09/19/2020 0808   LDLCALC 77 09/19/2020 0808   LDLCALC 151 (H) 12/30/2018  0846    Clinical ASCVD: No  The 10-year ASCVD risk score Denman George DC Jr., et al., 2013) is: 12.8%   Values used to calculate the score:     Age: 51 years     Sex: Male     Is Non-Hispanic African American: No     Diabetic: Yes     Tobacco smoker: Yes     Systolic Blood Pressure: 118 mmHg     Is BP treated: No     HDL Cholesterol: 33.6 mg/dL     Total Cholesterol: 144 mg/dL     BP Readings from Last 3 Encounters:  05/24/20 118/70  02/02/19 112/72  12/22/18 112/64    Care Plan  No Known Allergies  Medications Reviewed Today    Reviewed by Lourena Simmonds, RPH-CPP (Pharmacist) on 12/11/20 at 1305  Med List Status: <None>  Medication Order Taking? Sig Documenting Provider Last Dose Status Informant  JARDIANCE 25 MG TABS tablet [id number] Yes TAKE 1 TABLET BY MOUTH DAILY Dale [place], MD Taking Active   loratadine (CLARITIN) 10 MG tablet [id number] Yes Take 10 mg by mouth daily. [provider] Taking Active Self  rosuvastatin (CRESTOR) 20 MG tablet [id number] Yes Take 1 tablet (20 mg total) by mouth daily. Dale [place], MD Taking Active   vitamin B-12 (CYANOCOBALAMIN) 100 MCG tablet [id number] Yes Take 100 mcg by mouth daily. [provider] Taking Active           Patient Active Problem List   Diagnosis Date Noted  . Fatigue 05/25/2020  . Colon cancer screening 05/25/2020  . Decreased hearing  12/09/2019  . Sinusitis 05/03/2019  . COVID-19 virus detected 04/23/2019  . Hypercholesterolemia 02/05/2019  . Headache 02/02/2016  . Health care maintenance 09/29/2015  . Lateral epicondylitis 08/19/2015  . Right elbow pain 08/11/2015  . Left shoulder pain 08/11/2015  . Environmental allergies 08/11/2015  . Diabetes mellitus ([facility]) 08/09/2015    Conditions to be addressed/monitored: HLD and DMII  Care Plan : Medication Management  Updates made by Lourena Simmonds, RPH-CPP since 01/23/2021 12:00 AM    Problem: Diabetes, HLD     Long-Range Goal:  Disease Progression Prevention   Start Date: 12/11/2020  Recent Progress: On track  Priority: High  Note:   Current Barriers:  . Unable to independently afford treatment regimen . Unable to achieve control of diabetes   Pharmacist Clinical Goal(s):  Marland Kitchen Over the next 90 days, patient will achieve control of diabetes as evidenced by A1c  through collaboration with PharmD and provider.   Interventions: . 1:1 collaboration with Dale , MD regarding development and update of comprehensive plan of care as evidenced by provider attestation and co-signature . Inter-disciplinary care team collaboration (see longitudinal plan of care) . Comprehensive medication review performed; medication list updated in electronic medical record  Diabetes: . Uncontrolled; current treatment: Jardiance 25 mg daily  o Hx metformin: he reports today it was d/c d/t GI upset, documentation in chart also notes that he was concerned about renal damage . Current glucose readings: not checking at this time . Follow up with PCP as scheduled. Previously discussed options (d/c Jardiance and retrial of metformin XR with slow titration vs d/c Jardiance and change to [employer], as [employer] savings card has a max monthly value of $583, which will apply towards his deductible and should bring copay to $0)  Hyperlipidemia: . At goal LDL <100; current treatment: rosuvastatin 20 mg daily . Patient called today to request rosuvastatin refill. Order sent today.   . Recommended to continue current therapy at this time.    Patient Goals/Self-Care Activities . Over the next 90 days, patient will:  - take medications as prescribed check glucose daily, document, and provide at future appointments  Follow Up Plan: Telephone follow up appointment with care management team member scheduled for: ~ 8 weeks as previously scheduled      Medication Assistance:  None required.  Patient affirms current coverage meets needs.  Follow  Up:  Patient agrees to Care Plan and Follow-up.  Plan: Telephone follow up appointment with care management team member scheduled for:  ~ 8 weeks as previously scheduled  [name], PharmD, [place], CPP Clinical Pharmacist [employer] at [employer] [phone]

## 2021-01-29 ENCOUNTER — Other Ambulatory Visit: Payer: Self-pay

## 2021-01-29 ENCOUNTER — Ambulatory Visit (INDEPENDENT_AMBULATORY_CARE_PROVIDER_SITE_OTHER): Payer: BC Managed Care – PPO | Admitting: Internal Medicine

## 2021-01-29 VITALS — BP 118/70 | HR 68 | Temp 98.4°F | Resp 16 | Ht 73.0 in | Wt 243.0 lb

## 2021-01-29 DIAGNOSIS — D696 Thrombocytopenia, unspecified: Secondary | ICD-10-CM | POA: Diagnosis not present

## 2021-01-29 DIAGNOSIS — Z1211 Encounter for screening for malignant neoplasm of colon: Secondary | ICD-10-CM

## 2021-01-29 DIAGNOSIS — E1165 Type 2 diabetes mellitus with hyperglycemia: Secondary | ICD-10-CM

## 2021-01-29 DIAGNOSIS — Z7185 Encounter for immunization safety counseling: Secondary | ICD-10-CM

## 2021-01-29 DIAGNOSIS — E7800 Pure hypercholesterolemia, unspecified: Secondary | ICD-10-CM | POA: Diagnosis not present

## 2021-01-29 LAB — LIPID PANEL
Cholesterol: 195 mg/dL (ref 0–200)
HDL: 37.5 mg/dL — ABNORMAL LOW (ref >39.00)
LDL Cholesterol: 127 mg/dL — ABNORMAL HIGH (ref 0–99)
NonHDL: 157.91
Total CHOL/HDL Ratio: 5
Triglycerides: 153 mg/dL — ABNORMAL HIGH (ref 0.0–149.0)
VLDL: 30.6 mg/dL (ref 0.0–40.0)

## 2021-01-29 LAB — CBC WITH DIFFERENTIAL/PLATELET
Basophils Absolute: 0 10*3/uL (ref 0.0–0.1)
Basophils Relative: 0.5 % (ref 0.0–3.0)
Eosinophils Absolute: 0.1 10*3/uL (ref 0.0–0.7)
Eosinophils Relative: 2.6 % (ref 0.0–5.0)
HCT: 43.5 % (ref 39.0–52.0)
Hemoglobin: 14.9 g/dL (ref 13.0–17.0)
Lymphocytes Relative: 29.5 % (ref 12.0–46.0)
Lymphs Abs: 1.6 10*3/uL (ref 0.7–4.0)
MCHC: 34.1 g/dL (ref 30.0–36.0)
MCV: 92.8 fl (ref 78.0–100.0)
Monocytes Absolute: 0.4 10*3/uL (ref 0.1–1.0)
Monocytes Relative: 7.9 % (ref 3.0–12.0)
Neutro Abs: 3.3 10*3/uL (ref 1.4–7.7)
Neutrophils Relative %: 59.5 % (ref 43.0–77.0)
Platelets: 151 10*3/uL (ref 150.0–400.0)
RBC: 4.69 Mil/uL (ref 4.22–5.81)
RDW: 13.2 % (ref 11.5–15.5)
WBC: 5.5 10*3/uL (ref 4.0–10.5)

## 2021-01-29 LAB — BASIC METABOLIC PANEL
BUN: 17 mg/dL (ref 6–23)
CO2: 29 mEq/L (ref 19–32)
Calcium: 9.6 mg/dL (ref 8.4–10.5)
Chloride: 102 mEq/L (ref 96–112)
Creatinine, Ser: 0.99 mg/dL (ref 0.40–1.50)
GFR: 88.5 mL/min (ref >60.00)
Glucose, Bld: 100 mg/dL — ABNORMAL HIGH (ref 70–99)
Potassium: 4.2 mEq/L (ref 3.5–5.1)
Sodium: 139 mEq/L (ref 135–145)

## 2021-01-29 LAB — HM DIABETES FOOT EXAM

## 2021-01-29 LAB — HEPATIC FUNCTION PANEL
ALT: 11 U/L (ref 0–53)
AST: 11 U/L (ref 0–37)
Albumin: 4.5 g/dL (ref 3.5–5.2)
Alkaline Phosphatase: 59 U/L (ref 39–117)
Bilirubin, Direct: 0.1 mg/dL (ref 0.0–0.3)
Total Bilirubin: 0.8 mg/dL (ref 0.2–1.2)
Total Protein: 7.2 g/dL (ref 6.0–8.3)

## 2021-01-29 LAB — MICROALBUMIN / CREATININE URINE RATIO
Creatinine,U: 51.7 mg/dL
Microalb Creat Ratio: 1.4 mg/g (ref 0.0–30.0)
Microalb, Ur: <0.7 mg/dL (ref 0.0–1.9)

## 2021-01-29 LAB — HEMOGLOBIN A1C: Hgb A1c MFr Bld: 7.6 % — ABNORMAL HIGH (ref 4.6–6.5)

## 2021-01-29 NOTE — Progress Notes (Signed)
Patient ID: Edward Spencer, male   DOB: [birth year], 50 y.o.   MRN: [id number]   Subjective:    Patient ID: Edward Spencer, male    DOB: 05-Dec-1970, 50 y.o.   MRN: [id number]  HPI This visit occurred during the SARS-CoV-2 public health emergency.  Safety protocols were in place, including screening questions prior to the visit, additional usage of staff PPE, and extensive cleaning of exam room while observing appropriate contact time as indicated for disinfecting solutions.  Patient here for a scheduled follow up.  Here to follow up regarding his diabetes and cholesterol.  States he is doing relatively well.  Working.  Stays physically active.  No chest pain or sob reported.  No abdominal pain or bowel change reported.  Had covid x 2 - last 09/2020.  No residual problems.  Discussed pneumonia vaccine.  Will notify me if agreeable.  Due eye exam 02/2021.  States blood sugars averaging 130-150s.  Discussed diet and exercise.  Taking jardiance.  Expensive.  Previously on metformin - GI issues.  Discussed trial of extended release metformin.  States metformin did not control his sugars.  He is willing to try again.     Past Medical History:  Diagnosis Date  . Allergy   . Arthritis   . Asthma   . Depression   . Diabetes mellitus without complication ([facility])   . History of chicken pox    History reviewed. No pertinent surgical history. Family History  Problem Relation Age of Onset  . Arthritis Mother   . Hyperlipidemia Mother   . Arthritis Father   . Hypertension Father   . Diabetes Father   . Mental illness Maternal Aunt   . Hyperlipidemia Maternal Aunt   . Alcohol abuse Maternal Aunt   . Mental illness Paternal Aunt   . Alcohol abuse Paternal Aunt   . Arthritis Paternal Uncle   . Arthritis Maternal Grandmother   . Arthritis Paternal Grandmother   . Hyperlipidemia Paternal Grandmother   . Diabetes Paternal Grandmother    Social History   Socioeconomic History  . Marital status: Single     Spouse name: Not on file  . Number of children: Not on file  . Years of education: Not on file  . Highest education level: Not on file  Occupational History  . Not on file  Tobacco Use  . Smoking status: Current Every Day Smoker    Types: Cigarettes  . Smokeless tobacco: Never Used  Substance and Sexual Activity  . Alcohol use: Yes    Alcohol/week: 0.0 standard drinks    Comment: social  . Drug use: No  . Sexual activity: Not on file  Other Topics Concern  . Not on file  Social History Narrative  . Not on file   Social Determinants of Health   Financial Resource Strain: Low Risk   . Difficulty of Paying Living Expenses: Not hard at all  Food Insecurity: Not on file  Transportation Needs: Not on file  Physical Activity: Not on file  Stress: Not on file  Social Connections: Not on file    Outpatient Encounter Medications as of 01/29/2021  Medication Sig  . JARDIANCE 25 MG TABS tablet TAKE 1 TABLET BY MOUTH DAILY  . loratadine (CLARITIN) 10 MG tablet Take 10 mg by mouth daily.  . rosuvastatin (CRESTOR) 20 MG tablet Take 1 tablet (20 mg total) by mouth daily.  . vitamin B-12 (CYANOCOBALAMIN) 100 MCG tablet Take 100 mcg by mouth daily.  No facility-administered encounter medications on file as of 01/29/2021.    Review of Systems  Constitutional: Negative for appetite change and unexpected weight change.  HENT: Negative for congestion and sinus pressure.   Respiratory: Negative for cough, chest tightness and shortness of breath.   Cardiovascular: Negative for chest pain, palpitations and leg swelling.  Gastrointestinal: Negative for abdominal pain, diarrhea, nausea and vomiting.  Genitourinary: Negative for difficulty urinating and dysuria.  Musculoskeletal: Negative for joint swelling and myalgias.  Skin: Negative for color change and rash.  Neurological: Negative for dizziness, light-headedness and headaches.  Psychiatric/Behavioral: Negative for agitation and  dysphoric mood.       Objective:    Physical Exam Vitals reviewed.  Constitutional:      General: He is not in acute distress.    Appearance: Normal appearance. He is well-developed.  HENT:     Head: Normocephalic and atraumatic.     Right Ear: External ear normal.     Left Ear: External ear normal.  Eyes:     General: No scleral icterus.       Right eye: No discharge.        Left eye: No discharge.     Conjunctiva/sclera: Conjunctivae normal.  Cardiovascular:     Rate and Rhythm: Normal rate and regular rhythm.  Pulmonary:     Effort: Pulmonary effort is normal. No respiratory distress.     Breath sounds: Normal breath sounds.  Abdominal:     General: Bowel sounds are normal.     Palpations: Abdomen is soft.     Tenderness: There is no abdominal tenderness.  Musculoskeletal:        General: No swelling or tenderness.     Cervical back: Neck supple. No tenderness.  Lymphadenopathy:     Cervical: No cervical adenopathy.  Skin:    Findings: No erythema or rash.  Neurological:     Mental Status: He is alert.  Psychiatric:        Mood and Affect: Mood normal.        Behavior: Behavior normal.     BP 118/70   Pulse 68   Temp 98.4 F (36.9 C) (Oral)   Resp 16   Ht '6\' 1"'  (1.854 m)   Wt 243 lb (110.2 kg)   SpO2 97%   BMI 32.06 kg/m  Wt Readings from Last 3 Encounters:  01/29/21 243 lb (110.2 kg)  07/16/20 240 lb (108.9 kg)  05/24/20 240 lb (108.9 kg)     Lab Results  Component Value Date   WBC 5.5 01/29/2021   HGB 14.9 01/29/2021   HCT 43.5 01/29/2021   PLT 151.0 01/29/2021   GLUCOSE 100 (H) 01/29/2021   CHOL 195 01/29/2021   TRIG 153.0 (H) 01/29/2021   HDL 37.50 (L) 01/29/2021   LDLCALC 127 (H) 01/29/2021   ALT 11 01/29/2021   AST 11 01/29/2021   NA 139 01/29/2021   K 4.2 01/29/2021   CL 102 01/29/2021   CREATININE 0.99 01/29/2021   BUN 17 01/29/2021   CO2 29 01/29/2021   TSH 0.83 09/19/2020   PSA 0.30 09/19/2020   HGBA1C 7.6 (H) 01/29/2021    MICROALBUR <0.7 01/29/2021       Assessment & Plan:   Problem List Items Addressed This Visit    Colon cancer screening    Notify me when agreeable for colonoscopy.        Diabetes mellitus (HCC)    States sugars running 130-150s.  On jardiance.  Costly  per month.  Previous GI side effects with metformin.  Discussed trial of extended release metformin.  Did not feel metformin made a big difference in his blood sugars.  Discussed f/u with Catie to discuss cheaper alternative - possibly combined with metformin.  Low carb diet and exercise.  Follow met b and a1c.       Relevant Orders   Hemoglobin A1c (Completed)   Basic metabolic panel (Completed)   Microalbumin / creatinine urine ratio (Completed)   Hypercholesterolemia - Primary    On crestor.  Low cholesterol diet and exercise.  Follow lipid panel and liver function tests.        Relevant Orders   Hepatic function panel (Completed)   Lipid panel (Completed)   Thrombocytopenia (HCC)    Last platelet count wnl recent check.  Follow cbc.        Relevant Orders   CBC with Differential/Platelet (Completed)   Vaccine counseling    Discussed pneumonia vaccine given history of diabetes.  Also discuss shingles vaccine.            Einar Pheasant, MD

## 2021-02-02 ENCOUNTER — Encounter: Payer: Self-pay | Admitting: Internal Medicine

## 2021-02-02 DIAGNOSIS — Z7185 Encounter for immunization safety counseling: Secondary | ICD-10-CM | POA: Insufficient documentation

## 2021-02-02 NOTE — Assessment & Plan Note (Signed)
Discussed pneumonia vaccine given history of diabetes.  Also discuss shingles vaccine.

## 2021-02-02 NOTE — Assessment & Plan Note (Addendum)
States sugars running 130-150s.  On jardiance.  Costly per month.  Previous GI side effects with metformin.  Discussed trial of extended release metformin.  Did not feel metformin made a big difference in his blood sugars.  Discussed f/u with Catie to discuss cheaper alternative - possibly combined with metformin.  Low carb diet and exercise.  Follow met b and a1c.

## 2021-02-02 NOTE — Assessment & Plan Note (Signed)
On crestor.  Low cholesterol diet and exercise.  Follow lipid panel and liver function tests.   

## 2021-02-02 NOTE — Assessment & Plan Note (Signed)
Notify me when agreeable for colonoscopy.

## 2021-02-02 NOTE — Assessment & Plan Note (Signed)
Last platelet count wnl recent check.  Follow cbc.

## 2021-02-05 ENCOUNTER — Telehealth: Payer: BC Managed Care – PPO

## 2021-02-05 ENCOUNTER — Telehealth: Payer: Self-pay | Admitting: Pharmacist

## 2021-02-05 NOTE — Telephone Encounter (Signed)
  Chronic Care Management   Note  02/05/2021 Name: Edward Spencer MRN: [id number] DOB: Mar 05, 1971   Attempted to contact patient for scheduled appointment for medication management support. He noted he was busy at work today and could not talk. Rescheduled for next available appointment time that fit his work schedule.d   [name], PharmD, [place], CPP Veterinary surgeon at [facility] [phone]

## 2021-02-06 ENCOUNTER — Other Ambulatory Visit: Payer: Self-pay

## 2021-02-06 DIAGNOSIS — E7800 Pure hypercholesterolemia, unspecified: Secondary | ICD-10-CM

## 2021-02-06 MED ORDER — ROSUVASTATIN CALCIUM 40 MG PO TABS: 40.0000 mg | ORAL_TABLET | Freq: Every day | ORAL | 1 refills | Status: DC

## 2021-02-12 ENCOUNTER — Ambulatory Visit: Payer: BC Managed Care – PPO | Admitting: Pharmacist

## 2021-02-12 DIAGNOSIS — E7800 Pure hypercholesterolemia, unspecified: Secondary | ICD-10-CM

## 2021-02-12 DIAGNOSIS — E1165 Type 2 diabetes mellitus with hyperglycemia: Secondary | ICD-10-CM

## 2021-02-12 MED ORDER — METFORMIN HCL ER 500 MG PO TB24: 500.0000 mg | ORAL_TABLET | Freq: Two times a day (BID) | ORAL | 1 refills | Status: DC

## 2021-02-12 NOTE — Chronic Care Management (AMB) (Signed)
Care Management   Pharmacy Note  02/12/2021 Name: Edward Spencer  Subjective: Edward Spencer is a 50 y.o. year old male who is a primary care patient of Dale [place], MD. The Care Management team was consulted for assistance with care management and care coordination needs.    Engaged with patient by telephone for follow up visit in response to provider referral for pharmacy case management and/or care coordination services.   The patient was given information about Care Management services today including:  1. Care Management services includes personalized support from designated clinical staff supervised by the patient's primary care provider, including individualized plan of care and coordination with other care providers. 2. 24/7 contact phone numbers for assistance for urgent and routine care needs. 3. The patient may stop case management services at any time by phone call to the office staff.  Patient agreed to services and consent obtained.  Assessment:  Review of patient status, including review of consultants reports, laboratory and other test data, was performed as part of comprehensive evaluation and provision of chronic care management services.   SDOH (Social Determinants of Health) assessments and interventions performed:  none today  Objective:  Lab Results  Component Value Date   CREATININE 0.99 01/29/2021   CREATININE 1.04 09/19/2020   CREATININE 1.00 07/31/2019    Lab Results  Component Value Date   HGBA1C 7.6 (H) 01/29/2021       Component Value Date/Time   CHOL 195 01/29/2021 1228   TRIG 153.0 (H) 01/29/2021 1228   HDL 37.50 (L) 01/29/2021 1228   CHOLHDL 5 01/29/2021 1228   VLDL 30.6 01/29/2021 1228   LDLCALC 127 (H) 01/29/2021 1228   LDLCALC 151 (H) 12/30/2018 0846     Clinical ASCVD: No  The 10-year ASCVD risk score Denman George DC Jr., et al., 2013) is: 16.9%   Values used to calculate the score:     Age: 45  years     Sex: Male     Is Non-Hispanic African American: No     Diabetic: Yes     Tobacco smoker: Yes     Systolic Blood Pressure: 118 mmHg     Is BP treated: No     HDL Cholesterol: 37.5 mg/dL     Total Cholesterol: 195 mg/dL      BP Readings from Last 3 Encounters:  01/29/21 118/70  05/24/20 118/70  02/02/19 112/72    Care Plan  No Known Allergies  Medications Reviewed Today    Reviewed by Lourena Simmonds, RPH-CPP (Pharmacist) on 02/12/21 at 1118  Med List Status: <None>  Medication Order Taking? Sig Documenting Provider Last Dose Status Informant  loratadine (CLARITIN) 10 MG tablet [id number] Yes Take 10 mg by mouth daily. [provider] Taking Active Self  rosuvastatin (CRESTOR) 40 MG tablet [id number] Yes Take 1 tablet (40 mg total) by mouth daily. Dale [place], MD Taking Active   vitamin B-12 (CYANOCOBALAMIN) 100 MCG tablet [id number] Yes Take 100 mcg by mouth daily. [provider] Taking Active           Patient Active Problem List   Diagnosis Date Noted  . Vaccine counseling 02/02/2021  . Thrombocytopenia (HCC) 01/29/2021  . Fatigue 05/25/2020  . Colon cancer screening 05/25/2020  . Decreased hearing 12/09/2019  . Sinusitis 05/03/2019  . COVID-19 virus detected 04/23/2019  . Hypercholesterolemia 02/05/2019  . Headache 02/02/2016  . Health care maintenance 09/29/2015  . Lateral epicondylitis 08/19/2015  . Right elbow  pain 08/11/2015  . Left shoulder pain 08/11/2015  . Environmental allergies 08/11/2015  . Diabetes mellitus (HCC) 08/09/2015    Conditions to be addressed/monitored: HLD and DMII  Care Plan : Medication Management  Updates made by Lourena Simmonds, RPH-CPP since 02/12/2021 12:00 AM    Problem: Diabetes, HLD     Long-Range Goal: Disease Progression Prevention   Start Date: 12/11/2020  This Visit's Progress: On track  Recent Progress: On track  Priority: High  Note:   Current Barriers:  . Unable to  independently afford treatment regimen . Unable to achieve control of diabetes   Pharmacist Clinical Goal(s):  Marland Kitchen Over the next 90 days, patient will achieve control of diabetes as evidenced by A1c  through collaboration with PharmD and provider.   Interventions: . 1:1 collaboration with Dale Mercer, MD regarding development and update of comprehensive plan of care as evidenced by provider attestation and co-signature . Inter-disciplinary care team collaboration (see longitudinal plan of care) . Comprehensive medication review performed; medication list updated in electronic medical record  Diabetes: . Uncontrolled; current treatment: Jardiance 25 mg daily - unable to afford, unable to afford Steglatro w/ savings card either.  o Hx metformin: he reports today it was d/c d/t GI upset, documentation in chart also notes that he was concerned about renal damage . Current glucose readings: not checking at this time . Discussed retrial of metformin, but with extended release formulation. Patient amenable. Start metformin XR 500 mg w/ supper x 1 week, then add second tablet with breakfast. Continue 500 mg BID until our next call, and we will consider maximizing to 1000 mg BID pending tolerability.  . Encouraged home BG checks. Reviewed goal A1c, goal fasting and goal 2 hour post prandial glucose readings.   Hyperlipidemia: . Uncontrolled but anticipated to be improved with next lab work; current treatment: rosuvastatin 40 mg daily - increased dose sent last week . Continue increased rosuvastatin dose at this time  Patient Goals/Self-Care Activities . Over the next 90 days, patient will:  - take medications as prescribed check glucose daily, document, and provide at future appointments  Follow Up Plan: Telephone follow up appointment with care management team member scheduled for: ~ 6 weeks     Medication Assistance:  None required.  Patient affirms current coverage meets needs.  Follow  Up:  Patient agrees to Care Plan and Follow-up.  Plan: Telephone follow up appointment with care management team member scheduled for:  ~ 6 weeks  [name], PharmD, [place], CPP Clinical Pharmacist [facility] at [facility] [phone]

## 2021-02-12 NOTE — Patient Instructions (Signed)
Visit Information  Goals Addressed              This Visit's Progress     Patient Stated   .  Medication Monitoring (pt-stated)        Patient Goals/Self-Care Activities . Over the next 90 days, patient will:  - take medications as prescribed check glucose daily, document, and provide at future appointments         Patient verbalizes understanding of instructions provided today and agrees to view in MyChart.    Plan: Telephone follow up appointment with care management team member scheduled for:  ~ 6 weeks  [name], PharmD, [place], CPP Clinical Pharmacist [facility] at [employer] [phone]

## 2021-03-19 ENCOUNTER — Ambulatory Visit: Payer: BC Managed Care – PPO | Admitting: Pharmacist

## 2021-03-19 DIAGNOSIS — E7800 Pure hypercholesterolemia, unspecified: Secondary | ICD-10-CM

## 2021-03-19 DIAGNOSIS — E1165 Type 2 diabetes mellitus with hyperglycemia: Secondary | ICD-10-CM

## 2021-03-19 NOTE — Patient Instructions (Signed)
Visit Information  Goals Addressed              This Visit's Progress     Patient Stated   .  Medication Monitoring (pt-stated)        Patient Goals/Self-Care Activities . Over the next 90 days, patient will:  - take medications as prescribed check glucose daily, document, and provide at future appointments         Patient verbalizes understanding of instructions provided today and agrees to view in MyChart.    Plan: Telephone follow up appointment with care management team member scheduled for:  ~ 6 weeks  Catie Cyndie [place], PharmD, BCACP, CPP Clinical Pharmacist [facility] at [place] [phone]  

## 2021-03-19 NOTE — Chronic Care Management (AMB) (Signed)
Care Management   Pharmacy Note  03/19/2021 Name: Edward Spencer MRN: [id number] DOB: [birth year]  Subjective: Edward Spencer is a 50 y.o. year old male who is a primary care patient of Dale [place], MD. The Care Management team was consulted for assistance with care management and care coordination needs.    Engaged with patient by telephone for follow up visit in response to provider referral for pharmacy case management and/or care coordination services.   The patient was given information about Care Management services today including:  1. Care Management services includes personalized support from designated clinical staff supervised by the patient's primary care provider, including individualized plan of care and coordination with other care providers. 2. 24/7 contact phone numbers for assistance for urgent and routine care needs. 3. The patient may stop case management services at any time by phone call to the office staff.  Patient agreed to services and consent obtained.  Assessment:  Review of patient status, including review of consultants reports, laboratory and other test data, was performed as part of comprehensive evaluation and provision of chronic care management services.   SDOH (Social Determinants of Health) assessments and interventions performed:    Objective:  Lab Results  Component Value Date   CREATININE 0.99 01/29/2021   CREATININE 1.04 09/19/2020   CREATININE 1.00 07/31/2019    Lab Results  Component Value Date   HGBA1C 7.6 (H) 01/29/2021       Component Value Date/Time   CHOL 195 01/29/2021 1228   TRIG 153.0 (H) 01/29/2021 1228   HDL 37.50 (L) 01/29/2021 1228   CHOLHDL 5 01/29/2021 1228   VLDL 30.6 01/29/2021 1228   LDLCALC 127 (H) 01/29/2021 1228   LDLCALC 151 (H) 12/30/2018 0846    Clinical ASCVD: No  The 10-year ASCVD risk score Denman George DC Jr., et al., 2013) is: 16.9%   Values used to calculate the score:     Age: 40 years     Sex:  Male     Is Non-Hispanic African American: No     Diabetic: Yes     Tobacco smoker: Yes     Systolic Blood Pressure: 118 mmHg     Is BP treated: No     HDL Cholesterol: 37.5 mg/dL     Total Cholesterol: 195 mg/dL    BP Readings from Last 3 Encounters:  01/29/21 118/70  05/24/20 118/70  02/02/19 112/72    Care Plan  No Known Allergies  Medications Reviewed Today    Reviewed by Lourena Simmonds, RPH-CPP (Pharmacist) on 03/19/21 at 1115  Med List Status: <None>  Medication Order Taking? Sig Documenting Provider Last Dose Status Informant  loratadine (CLARITIN) 10 MG tablet [id number] Yes Take 10 mg by mouth daily. [provider] Taking Active Self  metFORMIN (GLUCOPHAGE XR) 500 MG 24 hr tablet [id number] Yes Take 1 tablet (500 mg total) by mouth 2 (two) times daily after a meal. Dale [place], MD Taking Active   rosuvastatin (CRESTOR) 40 MG tablet [id number] Yes Take 1 tablet (40 mg total) by mouth daily. Dale [place], MD Taking Active   vitamin B-12 (CYANOCOBALAMIN) 100 MCG tablet [id number] Yes Take 100 mcg by mouth daily. [provider] Taking Active           Patient Active Problem List   Diagnosis Date Noted  . Vaccine counseling 02/02/2021  . Thrombocytopenia (HCC) 01/29/2021  . Fatigue 05/25/2020  . Colon cancer screening 05/25/2020  . Decreased hearing 12/09/2019  . Sinusitis 05/03/2019  .  COVID-19 virus detected 04/23/2019  . Hypercholesterolemia 02/05/2019  . Headache 02/02/2016  . Health care maintenance 09/29/2015  . Lateral epicondylitis 08/19/2015  . Right elbow pain 08/11/2015  . Left shoulder pain 08/11/2015  . Environmental allergies 08/11/2015  . Diabetes mellitus (HCC) 08/09/2015    Conditions to be addressed/monitored: HLD and DMII  Care Plan : Medication Management  Updates made by Lourena Simmonds, RPH-CPP since 03/19/2021 12:00 AM    Problem: Diabetes, HLD     Long-Range Goal: Disease Progression Prevention   Start  Date: 12/11/2020  This Visit's Progress: On track  Recent Progress: On track  Priority: High  Note:   Current Barriers:  . Unable to independently afford treatment regimen . Unable to achieve control of diabetes   Pharmacist Clinical Goal(s):  Marland Kitchen Over the next 90 days, patient will achieve control of diabetes as evidenced by A1c  through collaboration with PharmD and provider.   Interventions: . 1:1 collaboration with Dale [place], MD regarding development and update of comprehensive plan of care as evidenced by provider attestation and co-signature . Inter-disciplinary care team collaboration (see longitudinal plan of care) . Comprehensive medication review performed; medication list updated in electronic medical record  Diabetes: . Uncontrolled; current treatment: metformin XR 500 mg BID; taking after lunch and after supper o Hx Jardiance/Steglatro - never took, patient has a high deductible plan and copay was unaffordable . Does report some occasional episodes of diarrhea, stomach cramping. Cannot tell if it has improve over time since restarting the medication, or has been constant.  . Current glucose readings: fastings/mid-afternoons are 130-160s . Patient elects to continue metformin for a few more weeks to see if symptoms resolve.  . If unable to continue on metformin or if not at goal on max tolerated metformin, and patient's insurance still covers brand medications (SGLT2, GLP1) at a level that is too high for him to afford, will consider pioglitazone vs sulfonylurea.   Hyperlipidemia: . Uncontrolled but anticipated to be improved with next lab work; current treatment: rosuvastatin 40 mg daily . Continue increased rosuvastatin dose at this time. Lab work with next PCP appointment  Patient Goals/Self-Care Activities . Over the next 90 days, patient will:  - take medications as prescribed check glucose daily, document, and provide at future appointments  Follow Up Plan:  Telephone follow up appointment with care management team member scheduled for: ~ 6 weeks     Medication Assistance:  None required.  Patient affirms current coverage meets needs.  Follow Up:  Patient agrees to Care Plan and Follow-up.  Plan: Telephone follow up appointment with care management team member scheduled for:  ~ 6 weeks  [name], PharmD, [place], CPP Clinical Pharmacist [facility] at [facility] [phone]

## 2021-05-01 ENCOUNTER — Telehealth: Payer: BC Managed Care – PPO

## 2021-05-06 ENCOUNTER — Telehealth: Payer: BC Managed Care – PPO

## 2021-05-09 ENCOUNTER — Ambulatory Visit: Payer: BC Managed Care – PPO | Admitting: Pharmacist

## 2021-05-09 DIAGNOSIS — E1165 Type 2 diabetes mellitus with hyperglycemia: Secondary | ICD-10-CM

## 2021-05-09 MED ORDER — STEGLATRO 5 MG PO TABS: 5.0000 mg | ORAL_TABLET | Freq: Every day | ORAL | 2 refills | Status: DC

## 2021-05-09 NOTE — Chronic Care Management (AMB) (Signed)
Care Management   Pharmacy Note  05/09/2021 Name: Edward Spencer MRN: [id number] DOB: October 16, 1971  Subjective: Edward Spencer is a 50 y.o. year old male who is a primary care patient of Dale Oxly, MD. The Care Management team was consulted for assistance with care management and care coordination needs.    Engaged with patient by telephone for follow up visit in response to provider referral for pharmacy case management and/or care coordination services.   The patient was given information about Care Management services today including:  Care Management services includes personalized support from designated clinical staff supervised by the patient's primary care provider, including individualized plan of care and coordination with other care providers. 24/7 contact phone numbers for assistance for urgent and routine care needs. The patient may stop case management services at any time by phone call to the office staff.  Patient agreed to services and consent obtained.  Assessment:  Review of patient status, including review of consultants reports, laboratory and other test data, was performed as part of comprehensive evaluation and provision of chronic care management services.   SDOH (Social Determinants of Health) assessments and interventions performed:  medication access  Objective:  Lab Results  Component Value Date   CREATININE 0.99 01/29/2021   CREATININE 1.04 09/19/2020   CREATININE 1.00 07/31/2019    Lab Results  Component Value Date   HGBA1C 7.6 (H) 01/29/2021       Component Value Date/Time   CHOL 195 01/29/2021 1228   TRIG 153.0 (H) 01/29/2021 1228   HDL 37.50 (L) 01/29/2021 1228   CHOLHDL 5 01/29/2021 1228   VLDL 30.6 01/29/2021 1228   LDLCALC 127 (H) 01/29/2021 1228   LDLCALC 151 (H) 12/30/2018 0846     Clinical ASCVD: No  The 10-year ASCVD risk score Denman George DC Jr., et al., 2013) is: 16.9%   Values used to calculate the score:     Age: 58 years      Sex: Male     Is Non-Hispanic African American: No     Diabetic: Yes     Tobacco smoker: Yes     Systolic Blood Pressure: 118 mmHg     Is BP treated: No     HDL Cholesterol: 37.5 mg/dL     Total Cholesterol: 195 mg/dL      BP Readings from Last 3 Encounters:  01/29/21 118/70  05/24/20 118/70  02/02/19 112/72    Care Plan  No Known Allergies  Medications Reviewed Today     Reviewed by Lourena Simmonds, RPH-CPP (Pharmacist) on 03/19/21 at 1115  Med List Status: <None>   Medication Order Taking? Sig Documenting Provider Last Dose Status Informant  loratadine (CLARITIN) 10 MG tablet [id number] Yes Take 10 mg by mouth daily. [provider] Taking Active Self  metFORMIN (GLUCOPHAGE XR) 500 MG 24 hr tablet [id number] Yes Take 1 tablet (500 mg total) by mouth 2 (two) times daily after a meal. Dale Clint, MD Taking Active   rosuvastatin (CRESTOR) 40 MG tablet [id number] Yes Take 1 tablet (40 mg total) by mouth daily. Dale , MD Taking Active   vitamin B-12 (CYANOCOBALAMIN) 100 MCG tablet [id number] Yes Take 100 mcg by mouth daily. [provider] Taking Active             Patient Active Problem List   Diagnosis Date Noted   Vaccine counseling 02/02/2021   Thrombocytopenia (HCC) 01/29/2021   Fatigue 05/25/2020   Colon cancer screening 05/25/2020   Decreased hearing 12/09/2019  Sinusitis 05/03/2019   COVID-19 virus detected 04/23/2019   Hypercholesterolemia 02/05/2019   Headache 02/02/2016   Health care maintenance 09/29/2015   Lateral epicondylitis 08/19/2015   Right elbow pain 08/11/2015   Left shoulder pain 08/11/2015   Environmental allergies 08/11/2015   Diabetes mellitus (HCC) 08/09/2015    Conditions to be addressed/monitored: HLD and DMII  Care Plan : Medication Management  Updates made by Lourena Simmonds, RPH-CPP since 05/09/2021 12:00 AM     Problem: Diabetes, HLD      Long-Range Goal: Disease Progression Prevention    Start Date: 12/11/2020  This Visit's Progress: On track  Recent Progress: On track  Priority: High  Note:   Current Barriers:  Unable to independently afford treatment regimen Unable to achieve control of diabetes   Pharmacist Clinical Goal(s):  Over the next 90 days, patient will achieve control of diabetes as evidenced by A1c  through collaboration with PharmD and provider.   Interventions: 1:1 collaboration with Dale [place], MD regarding development and update of comprehensive plan of care as evidenced by provider attestation and co-signature Inter-disciplinary care team collaboration (see longitudinal plan of care) Comprehensive medication review performed; medication list updated in electronic medical record  Diabetes: Uncontrolled; current treatment: metformin XR 500 mg BID; taking after lunch and after supper Hx Jardiance/Steglatro - never took, patient has a high deductible plan and copay was unaffordable Reports continued dissatisfaction with metformin therapy. Reports stomach cramping as well as tiredness.  Current glucose readings: fastings/mid-afternoons are 130-160s Patient elects to continue metformin for a few more weeks to see if symptoms resolve.  Investigating if Steglatro savings card issue had resolved. Sent script for KeySpan 5 mg daily and assisted patient in downloading savings card information Richrd Humbles: [id number]; RXPCNNorma Fredrickson; Alvira Philips: [id number]; ID: [id number]) Counseled on SGLT2, including mechanism of action, side effects, and benefits. Discussed potential side effects of dehydration, genitourinary infections. Encouraged adequate hydration and genital hygiene. Advised on sick day rules (if a day with significantly reduced oral intake, serious vomiting, or diarrhea, hold SGLT2). Patient verbalized understanding. Stop metformin, start Steglatro 5 mg daily. Follow up with PCP later this month. Can increase dose if not at goal.  Hyperlipidemia: Uncontrolled but  anticipated to be improved with next lab work; current treatment: rosuvastatin 40 mg daily Continue increased rosuvastatin dose at this time. Lab work with next PCP appointment  Patient Goals/Self-Care Activities Over the next 90 days, patient will:  - take medications as prescribed check glucose daily, document, and provide at future appointments  Follow Up Plan: Telephone follow up appointment with care management team member scheduled for: ~ 8 weeks     Medication Assistance:  None required.  Patient affirms current coverage meets needs.  Follow Up:  Patient agrees to Care Plan and Follow-up.  Plan: Telephone follow up appointment with care management team member scheduled for:  ~8 weeks  [name], PharmD, [place], CPP Clinical Pharmacist [facility] [phone]

## 2021-05-09 NOTE — Patient Instructions (Signed)
Visit Information   Goals Addressed               This Visit's Progress     Patient Stated     Medication Monitoring (pt-stated)        Patient Goals/Self-Care Activities Over the next 90 days, patient will:  - take medications as prescribed check glucose daily, document, and provide at future appointments          Patient verbalizes understanding of instructions provided today and agrees to view in MyChart.    Plan: Telephone follow up appointment with care management team member scheduled for:  ~8 weeks  [name], PharmD, [place], CPP Clinical Pharmacist [facility] [phone]

## 2021-06-12 ENCOUNTER — Other Ambulatory Visit: Payer: Self-pay

## 2021-06-12 ENCOUNTER — Ambulatory Visit (INDEPENDENT_AMBULATORY_CARE_PROVIDER_SITE_OTHER): Payer: BC Managed Care – PPO | Admitting: Internal Medicine

## 2021-06-12 ENCOUNTER — Encounter: Payer: Self-pay | Admitting: Internal Medicine

## 2021-06-12 VITALS — BP 92/56 | HR 65 | Temp 99.0°F | Ht 73.0 in | Wt 246.0 lb

## 2021-06-12 DIAGNOSIS — E1165 Type 2 diabetes mellitus with hyperglycemia: Secondary | ICD-10-CM | POA: Diagnosis not present

## 2021-06-12 DIAGNOSIS — M549 Dorsalgia, unspecified: Secondary | ICD-10-CM | POA: Insufficient documentation

## 2021-06-12 DIAGNOSIS — E7800 Pure hypercholesterolemia, unspecified: Secondary | ICD-10-CM

## 2021-06-12 DIAGNOSIS — M545 Low back pain, unspecified: Secondary | ICD-10-CM | POA: Diagnosis not present

## 2021-06-12 DIAGNOSIS — D696 Thrombocytopenia, unspecified: Secondary | ICD-10-CM | POA: Diagnosis not present

## 2021-06-12 DIAGNOSIS — Z0000 Encounter for general adult medical examination without abnormal findings: Secondary | ICD-10-CM | POA: Diagnosis not present

## 2021-06-12 NOTE — Assessment & Plan Note (Addendum)
Physical today 06/11/21.  Discussed colonoscopy.  Will notify me when agreeable for colonoscopy.  Follow PSA.

## 2021-06-12 NOTE — Progress Notes (Signed)
Patient ID: Edward Spencer, male   DOB: 04-08-1971, 50 y.o.   MRN: [id number]   Subjective:    Patient ID: Edward Spencer, male    DOB: [birth year], 50 y.o.   MRN: [id number]  HPI This visit occurred during the SARS-CoV-2 public health emergency.  Safety protocols were in place, including screening questions prior to the visit, additional usage of staff PPE, and extensive cleaning of exam room while observing appropriate contact time as indicated for disinfecting solutions.   Patient here for his physical exam.  Following up regarding his diabetes.  He is off metformin.  Taking and tolerating Steglatro.  States morning sugars are averaging in the 150s range.  Discussed low-carb diet and exercise.  Appears the highest readings are in the 180s.  Stays physically active.  No chest pain or tightness with activity or exertion.  No shortness of breath reported.  No acid reflux reported.  No abdominal pain or bowel change reported.  Past Medical History:  Diagnosis Date   Allergy    Arthritis    Asthma    Depression    Diabetes mellitus without complication (HCC)    History of chicken pox    History reviewed. No pertinent surgical history. Family History  Problem Relation Age of Onset   Arthritis Mother    Hyperlipidemia Mother    Arthritis Father    Hypertension Father    Diabetes Father    Mental illness Maternal Aunt    Hyperlipidemia Maternal Aunt    Alcohol abuse Maternal Aunt    Mental illness Paternal Aunt    Alcohol abuse Paternal Aunt    Arthritis Paternal Uncle    Arthritis Maternal Grandmother    Arthritis Paternal Grandmother    Hyperlipidemia Paternal Grandmother    Diabetes Paternal Grandmother    Social History   Socioeconomic History   Marital status: Single    Spouse name: Not on file   Number of children: Not on file   Years of education: Not on file   Highest education level: Not on file  Occupational History   Not on file  Tobacco Use   Smoking status: Every  Day    Types: Cigarettes   Smokeless tobacco: Never  Substance and Sexual Activity   Alcohol use: Yes    Alcohol/week: 0.0 standard drinks    Comment: social   Drug use: No   Sexual activity: Not on file  Other Topics Concern   Not on file  Social History Narrative   Not on file   Social Determinants of Health   Financial Resource Strain: Low Risk    Difficulty of Paying Living Expenses: Not hard at all  Food Insecurity: Not on file  Transportation Needs: Not on file  Physical Activity: Not on file  Stress: Not on file  Social Connections: Not on file    Review of Systems  Constitutional:  Negative for appetite change and unexpected weight change.  HENT:  Negative for congestion, sinus pressure and sore throat.   Eyes:  Negative for pain and visual disturbance.  Respiratory:  Negative for cough, chest tightness and shortness of breath.   Cardiovascular:  Negative for chest pain, palpitations and leg swelling.  Gastrointestinal:  Negative for abdominal pain, diarrhea, nausea and vomiting.  Genitourinary:  Negative for difficulty urinating and dysuria.  Musculoskeletal:  Negative for joint swelling and myalgias.  Skin:  Negative for color change and rash.  Neurological:  Negative for dizziness, light-headedness and headaches.  Hematological:  Negative for adenopathy. Does not bruise/bleed easily.  Psychiatric/Behavioral:  Negative for agitation and dysphoric mood.       Objective:    Physical Exam Constitutional:      General: He is not in acute distress.    Appearance: Normal appearance. He is well-developed.  HENT:     Head: Normocephalic and atraumatic.     Right Ear: External ear normal.     Left Ear: External ear normal.  Eyes:     General: No scleral icterus.       Right eye: No discharge.        Left eye: No discharge.     Conjunctiva/sclera: Conjunctivae normal.  Neck:     Thyroid: No thyromegaly.  Cardiovascular:     Rate and Rhythm: Normal rate and  regular rhythm.  Pulmonary:     Effort: No respiratory distress.     Breath sounds: Normal breath sounds. No wheezing.  Abdominal:     General: Bowel sounds are normal.     Palpations: Abdomen is soft.     Tenderness: There is no abdominal tenderness.  Musculoskeletal:        General: No swelling or tenderness.     Cervical back: Neck supple. No tenderness.  Lymphadenopathy:     Cervical: No cervical adenopathy.  Skin:    Findings: No erythema or rash.  Neurological:     Mental Status: He is alert and oriented to person, place, and time.  Psychiatric:        Mood and Affect: Mood normal.        Behavior: Behavior normal.    BP (!) 92/56 (BP Location: Left Arm, Patient Position: Sitting, Cuff Size: Large)   Pulse 65   Temp 99 F (37.2 C) (Oral)   Ht 6\' 1"  (1.854 m)   Wt 246 lb (111.6 kg)   SpO2 97%   BMI 32.46 kg/m  Wt Readings from Last 3 Encounters:  06/12/21 246 lb (111.6 kg)  01/29/21 243 lb (110.2 kg)  07/16/20 240 lb (108.9 kg)    Outpatient Encounter Medications as of 06/12/2021  Medication Sig   ertugliflozin L-PyroglutamicAc (STEGLATRO) 5 MG TABS tablet Take 1 tablet (5 mg total) by mouth daily.   loratadine (CLARITIN) 10 MG tablet Take 10 mg by mouth daily.   rosuvastatin (CRESTOR) 40 MG tablet Take 1 tablet (40 mg total) by mouth daily.   vitamin B-12 (CYANOCOBALAMIN) 100 MCG tablet Take 100 mcg by mouth daily.   [DISCONTINUED] metFORMIN (GLUCOPHAGE XR) 500 MG 24 hr tablet Take 1 tablet (500 mg total) by mouth 2 (two) times daily after a meal. (Patient not taking: Reported on 06/12/2021)   No facility-administered encounter medications on file as of 06/12/2021.     Lab Results  Component Value Date   WBC 5.5 01/29/2021   HGB 14.9 01/29/2021   HCT 43.5 01/29/2021   PLT 151.0 01/29/2021   GLUCOSE 100 (H) 01/29/2021   CHOL 195 01/29/2021   TRIG 153.0 (H) 01/29/2021   HDL 37.50 (L) 01/29/2021   LDLCALC 127 (H) 01/29/2021   ALT 11 01/29/2021   AST 11  01/29/2021   NA 139 01/29/2021   K 4.2 01/29/2021   CL 102 01/29/2021   CREATININE 0.99 01/29/2021   BUN 17 01/29/2021   CO2 29 01/29/2021   TSH 0.83 09/19/2020   PSA 0.30 09/19/2020   HGBA1C 7.6 (H) 01/29/2021   MICROALBUR <0.7 01/29/2021       Assessment & Plan:  Problem List Items Addressed This Visit     Back pain   Relevant Orders   Urinalysis, Routine w reflex microscopic (Completed)   Diabetes mellitus (HCC)    Off metformin.  On Steglatro.  Sugars as outlined.  Just recently started this medication.  Hold on making adjustments.  May need to increase the dose.  Discussed low-carb diet and exercise.  Check metabolic panel and A1c.  He is seen today.  We will have him return soon for labs.       Health care maintenance    Physical today 06/11/21.  Discussed colonoscopy.  Will notify me when agreeable for colonoscopy.  Follow PSA.       Hypercholesterolemia    On crestor.  Low cholesterol diet and exercise.  Follow lipid panel and liver function tests.         Thrombocytopenia (HCC)    Platelet count slightly decreased on previous check.  Most recent check within normal limits.  Follow CBC.       Relevant Orders   CBC with Differential/Platelet   Other Visit Diagnoses     Routine general medical examination at a health care facility    -  Primary        Dale [place], MD

## 2021-06-13 LAB — URINALYSIS, ROUTINE W REFLEX MICROSCOPIC
Bilirubin Urine: NEGATIVE
Hgb urine dipstick: NEGATIVE
Ketones, ur: NEGATIVE
Leukocytes,Ua: NEGATIVE
Nitrite: NEGATIVE
RBC / HPF: NONE SEEN (ref 0–?)
Specific Gravity, Urine: 1.01 (ref 1.000–1.030)
Total Protein, Urine: NEGATIVE
Urine Glucose: >=1000 — AB
Urobilinogen, UA: 0.2 (ref 0.0–1.0)
WBC, UA: NONE SEEN (ref 0–?)
pH: 6.5 (ref 5.0–8.0)

## 2021-06-20 ENCOUNTER — Telehealth: Payer: Self-pay | Admitting: *Deleted

## 2021-06-20 DIAGNOSIS — E7800 Pure hypercholesterolemia, unspecified: Secondary | ICD-10-CM

## 2021-06-20 DIAGNOSIS — E1165 Type 2 diabetes mellitus with hyperglycemia: Secondary | ICD-10-CM

## 2021-06-20 NOTE — Telephone Encounter (Signed)
Please place future orders for lab appt.  

## 2021-06-21 ENCOUNTER — Other Ambulatory Visit: Payer: Self-pay | Admitting: Internal Medicine

## 2021-06-21 DIAGNOSIS — E7800 Pure hypercholesterolemia, unspecified: Secondary | ICD-10-CM

## 2021-06-21 NOTE — Telephone Encounter (Signed)
Orders placed for labs

## 2021-06-22 ENCOUNTER — Encounter: Payer: Self-pay | Admitting: Internal Medicine

## 2021-06-22 NOTE — Assessment & Plan Note (Signed)
Off metformin.  On Steglatro.  Sugars as outlined.  Just recently started this medication.  Hold on making adjustments.  May need to increase the dose.  Discussed low-carb diet and exercise.  Check metabolic panel and A1c.  He is seen today.  We will have him return soon for labs.

## 2021-06-22 NOTE — Assessment & Plan Note (Signed)
Platelet count slightly decreased on previous check.  Most recent check within normal limits.  Follow CBC.

## 2021-06-22 NOTE — Assessment & Plan Note (Signed)
On crestor.  Low cholesterol diet and exercise.  Follow lipid panel and liver function tests.   

## 2021-06-23 ENCOUNTER — Other Ambulatory Visit (INDEPENDENT_AMBULATORY_CARE_PROVIDER_SITE_OTHER): Payer: BC Managed Care – PPO

## 2021-06-23 ENCOUNTER — Other Ambulatory Visit: Payer: Self-pay

## 2021-06-23 DIAGNOSIS — E7800 Pure hypercholesterolemia, unspecified: Secondary | ICD-10-CM | POA: Diagnosis not present

## 2021-06-23 DIAGNOSIS — D696 Thrombocytopenia, unspecified: Secondary | ICD-10-CM | POA: Diagnosis not present

## 2021-06-23 DIAGNOSIS — E1165 Type 2 diabetes mellitus with hyperglycemia: Secondary | ICD-10-CM

## 2021-06-23 LAB — CBC WITH DIFFERENTIAL/PLATELET
Basophils Absolute: 0 10*3/uL (ref 0.0–0.1)
Basophils Relative: 0.6 % (ref 0.0–3.0)
Eosinophils Absolute: 0.2 10*3/uL (ref 0.0–0.7)
Eosinophils Relative: 3.5 % (ref 0.0–5.0)
HCT: 45.8 % (ref 39.0–52.0)
Hemoglobin: 15.2 g/dL (ref 13.0–17.0)
Lymphocytes Relative: 28.9 % (ref 12.0–46.0)
Lymphs Abs: 1.6 10*3/uL (ref 0.7–4.0)
MCHC: 33.3 g/dL (ref 30.0–36.0)
MCV: 93.1 fl (ref 78.0–100.0)
Monocytes Absolute: 0.5 10*3/uL (ref 0.1–1.0)
Monocytes Relative: 8.6 % (ref 3.0–12.0)
Neutro Abs: 3.2 10*3/uL (ref 1.4–7.7)
Neutrophils Relative %: 58.4 % (ref 43.0–77.0)
Platelets: 154 10*3/uL (ref 150.0–400.0)
RBC: 4.92 Mil/uL (ref 4.22–5.81)
RDW: 13.2 % (ref 11.5–15.5)
WBC: 5.5 10*3/uL (ref 4.0–10.5)

## 2021-06-23 LAB — BASIC METABOLIC PANEL
BUN: 21 mg/dL (ref 6–23)
CO2: 30 mEq/L (ref 19–32)
Calcium: 9.7 mg/dL (ref 8.4–10.5)
Chloride: 103 mEq/L (ref 96–112)
Creatinine, Ser: 0.99 mg/dL (ref 0.40–1.50)
GFR: 88.25 mL/min (ref >60.00)
Glucose, Bld: 165 mg/dL — ABNORMAL HIGH (ref 70–99)
Potassium: 4.8 mEq/L (ref 3.5–5.1)
Sodium: 139 mEq/L (ref 135–145)

## 2021-06-23 LAB — LIPID PANEL
Cholesterol: 140 mg/dL (ref 0–200)
HDL: 38.9 mg/dL — ABNORMAL LOW (ref >39.00)
LDL Cholesterol: 76 mg/dL (ref 0–99)
NonHDL: 101.53
Total CHOL/HDL Ratio: 4
Triglycerides: 128 mg/dL (ref 0.0–149.0)
VLDL: 25.6 mg/dL (ref 0.0–40.0)

## 2021-06-23 LAB — HEMOGLOBIN A1C: Hgb A1c MFr Bld: 7.7 % — ABNORMAL HIGH (ref 4.6–6.5)

## 2021-06-23 LAB — HEPATIC FUNCTION PANEL
ALT: 16 U/L (ref 0–53)
AST: 14 U/L (ref 0–37)
Albumin: 4.6 g/dL (ref 3.5–5.2)
Alkaline Phosphatase: 56 U/L (ref 39–117)
Bilirubin, Direct: 0.1 mg/dL (ref 0.0–0.3)
Total Bilirubin: 0.7 mg/dL (ref 0.2–1.2)
Total Protein: 7.1 g/dL (ref 6.0–8.3)

## 2021-07-11 ENCOUNTER — Encounter: Payer: Self-pay | Admitting: Internal Medicine

## 2021-07-14 NOTE — Telephone Encounter (Signed)
Spoke with patient to discuss my chart message. On Thursday he was put on administrative leave at work due to accusations that he did not go into detail about. Since this event, patient has had increased anxiety. Feels like his mind is constantly racing and he just feels exhausted from being overly stressed. His right eye has been twitching and he has not been able to control it. He denies chest pain, sob, etc. He has only taken wellbutrin in the past and stated that he did not feel this helped him- only made him gain weight. Patient has agreed to do virtual visit to discuss with you. Can we move up your last patient tomorrow and add him for virtual at the end of day (we have a 2:00 opening to do this)? Patient is aware that you are ending early today and is ok to wait until tomorrow.

## 2021-07-14 NOTE — Telephone Encounter (Signed)
Ok to work in tomorrow.  

## 2021-07-14 NOTE — Telephone Encounter (Signed)
Patient scheduled.

## 2021-07-15 ENCOUNTER — Encounter: Payer: Self-pay | Admitting: Internal Medicine

## 2021-07-15 ENCOUNTER — Telehealth (INDEPENDENT_AMBULATORY_CARE_PROVIDER_SITE_OTHER): Payer: BC Managed Care – PPO | Admitting: Internal Medicine

## 2021-07-15 DIAGNOSIS — F439 Reaction to severe stress, unspecified: Secondary | ICD-10-CM | POA: Diagnosis not present

## 2021-07-15 DIAGNOSIS — E1165 Type 2 diabetes mellitus with hyperglycemia: Secondary | ICD-10-CM

## 2021-07-15 NOTE — Progress Notes (Addendum)
Patient ID: Edward Spencer, male   DOB: Nov 27, 1970, 50 y.o.   MRN: [id number]   Virtual Visit via video Note  This visit type was conducted due to national recommendations for restrictions regarding the COVID-19 pandemic (e.g. social distancing).  This format is felt to be most appropriate for this patient at this time.  All issues noted in this document were discussed and addressed.  No physical exam was performed (except for noted visual exam findings with Video Visits).   I connected with Dom[place]a [place] by a video enabled telemedicine application and verified that I am speaking with the correct person using two identifiers. Location patient: home Location provider: work  Persons participating in the virtual visit: patient, provider  The limitations, risks, security and privacy concerns of performing an evaluation and management service by video and the availability of in person appointments have been discussed.  It has also been discussed with the patient that there may be a patient responsible charge related to this service. The patient expressed understanding and agreed to proceed.   Reason for visit:  work in appt  HPI: Work in for increased stress.  Increased stress related to a work incident.  He was placed on administrative leave.  Is back at work today and has been told "issue resolved", but does not know specifics.  No one is communicating with him what is going on.  Increased stress related.  Concerned about his job.  Not sleeping well.  Feels anxious.  When first occurred, felt anxious and felt like he was not getting in a good breath.  Has noticed muscle/eye twitching.  Eating.  Feels needs something to help calm him down.  Denies any recreational drug use.  Occasional alcohol use.     ROS: See pertinent positives and negatives per HPI.  Past Medical History:  Diagnosis Date   Allergy    Arthritis    Asthma    Depression    Diabetes mellitus without complication (HCC)     History of chicken pox     History reviewed. No pertinent surgical history.  Family History  Problem Relation Age of Onset   Arthritis Mother    Hyperlipidemia Mother    Arthritis Father    Hypertension Father    Diabetes Father    Mental illness Maternal Aunt    Hyperlipidemia Maternal Aunt    Alcohol abuse Maternal Aunt    Mental illness Paternal Aunt    Alcohol abuse Paternal Aunt    Arthritis Paternal Uncle    Arthritis Maternal Grandmother    Arthritis Paternal Grandmother    Hyperlipidemia Paternal Grandmother    Diabetes Paternal Grandmother     SOCIAL HX: reviewed.    Current Outpatient Medications:    ertugliflozin L-PyroglutamicAc (STEGLATRO) 5 MG TABS tablet, Take 1 tablet (5 mg total) by mouth daily., Disp: 30 tablet, Rfl: 2   loratadine (CLARITIN) 10 MG tablet, Take 10 mg by mouth daily., Disp: , Rfl:    rosuvastatin (CRESTOR) 40 MG tablet, TAKE 1 TABLET(40 MG) BY MOUTH DAILY, Disp: 90 tablet, Rfl: 1   vitamin B-12 (CYANOCOBALAMIN) 100 MCG tablet, Take 100 mcg by mouth daily., Disp: , Rfl:   EXAM:  GENERAL: alert, oriented, appears well and in no acute distress  HEENT: atraumatic, conjunttiva clear, no obvious abnormalities on inspection of external nose and ears  NECK: normal movements of the head and neck  LUNGS: on inspection no signs of respiratory distress, breathing rate appears normal, no obvious gross SOB,  gasping or wheezing  CV: no obvious cyanosis  PSYCH/NEURO: pleasant and cooperative, no obvious depression or anxiety, speech and thought processing grossly intact  ASSESSMENT AND PLAN:  Discussed the following assessment and plan:  Problem List Items Addressed This Visit     Diabetes mellitus (HCC)    On steglatro.  Follow sugars.       Stress    Increased stress and anxiety as outlined.  Not sleeping.  Feels needs something to help level things out.  Has good support at home.  Has been on wellbutrin and lexapro previously.  Did not  tolerate or did not work for him.  Discussed treatment options.  buspar 10mg  bid.  If not effective, consider trial of effexor.  Also, possibly trazodone to help with sleep.        Return if symptoms worsen or fail to improve.   I discussed the assessment and treatment plan with the patient. The patient was provided an opportunity to ask questions and all were answered. The patient agreed with the plan and demonstrated an understanding of the instructions.   The patient was advised to call back or seek an in-person evaluation if the symptoms worsen or if the condition fails to improve as anticipated.    , MD

## 2021-07-17 ENCOUNTER — Other Ambulatory Visit: Payer: Self-pay

## 2021-07-17 ENCOUNTER — Telehealth: Payer: Self-pay | Admitting: Internal Medicine

## 2021-07-17 DIAGNOSIS — F439 Reaction to severe stress, unspecified: Secondary | ICD-10-CM | POA: Insufficient documentation

## 2021-07-17 MED ORDER — BUSPIRONE HCL 10 MG PO TABS: 10.0000 mg | ORAL_TABLET | Freq: Two times a day (BID) | ORAL | 1 refills | Status: DC

## 2021-07-17 NOTE — Telephone Encounter (Signed)
LMTCB

## 2021-07-17 NOTE — Telephone Encounter (Signed)
Please call Edward Spencer and notify him that I would like to start him on buspar 10mg  bid.  If agreeable, let me know and will send in rx. Also, he will need to keep me posted on how he is doing.

## 2021-07-17 NOTE — Telephone Encounter (Signed)
Patient aware of below. I have sent in medication #60 with 1 refill. Pt has f/u in November.

## 2021-07-17 NOTE — Assessment & Plan Note (Signed)
On steglatro.  Follow sugars.

## 2021-07-17 NOTE — Assessment & Plan Note (Addendum)
Increased stress and anxiety as outlined.  Not sleeping.  Feels needs something to help level things out.  Has good support at home.  Has been on wellbutrin and lexapro previously.  Did not tolerate or did not work for him.  Discussed treatment options.  buspar 10mg  bid.  If not effective, consider trial of effexor.  Also, possibly trazodone to help with sleep.

## 2021-07-18 ENCOUNTER — Ambulatory Visit: Payer: BC Managed Care – PPO | Admitting: Pharmacist

## 2021-07-18 DIAGNOSIS — E7800 Pure hypercholesterolemia, unspecified: Secondary | ICD-10-CM

## 2021-07-18 DIAGNOSIS — E1165 Type 2 diabetes mellitus with hyperglycemia: Secondary | ICD-10-CM

## 2021-07-18 DIAGNOSIS — F439 Reaction to severe stress, unspecified: Secondary | ICD-10-CM

## 2021-07-18 MED ORDER — TIRZEPATIDE 2.5 MG/0.5ML ~~LOC~~ SOAJ: 2.5000 mg | SUBCUTANEOUS | 2 refills | Status: DC

## 2021-07-18 NOTE — Patient Instructions (Signed)
Visit Information   Goals Addressed               This Visit's Progress     Patient Stated     Medication Monitoring (pt-stated)        Patient Goals/Self-Care Activities Over the next 90 days, patient will:  - take medications as prescribed check glucose daily, document, and provide at future appointments        Patient verbalizes understanding of instructions provided today and agrees to view in MyChart.   Plan: Telephone follow up appointment with care management team member scheduled for:  ~5 days  [name], PharmD, [place], CPP Clinical Pharmacist [facility] [employer] [phone]

## 2021-07-18 NOTE — Chronic Care Management (AMB) (Signed)
Care Management   Pharmacy Note  07/18/2021 Name: Edward Spencer MRN: [id number] DOB: [birth year]  Subjective: Edward Spencer is a 50 y.o. year old male who is a primary care patient of Dale [place], MD. The Care Management team was consulted for assistance with care management and care coordination needs.    Engaged with patient by telephone for follow up visit in response to provider referral for pharmacy case management and/or care coordination services.   The patient was given information about Care Management services today including:  Care Management services includes personalized support from designated clinical staff supervised by the patient's primary care provider, including individualized plan of care and coordination with other care providers. 24/7 contact phone numbers for assistance for urgent and routine care needs. The patient may stop case management services at any time by phone call to the office staff.  Patient agreed to services and consent obtained.  Assessment:  Review of patient status, including review of consultants reports, laboratory and other test data, was performed as part of comprehensive evaluation and provision of chronic care management services.   SDOH (Social Determinants of Health) assessments and interventions performed:    Objective:  Lab Results  Component Value Date   CREATININE 0.99 06/23/2021   CREATININE 0.99 01/29/2021   CREATININE 1.04 09/19/2020    Lab Results  Component Value Date   HGBA1C 7.7 (H) 06/23/2021       Component Value Date/Time   CHOL 140 06/23/2021 0808   TRIG 128.0 06/23/2021 0808   HDL 38.90 (L) 06/23/2021 0808   CHOLHDL 4 06/23/2021 0808   VLDL 25.6 06/23/2021 0808   LDLCALC 76 06/23/2021 0808   LDLCALC 151 (H) 12/30/2018 0846     Clinical ASCVD: No  The 10-year ASCVD risk score Denman George DC Jr., et al., 2013) is: 7%   Values used to calculate the score:     Age: 71 years     Sex: Male     Is  Non-Hispanic African American: No     Diabetic: Yes     Tobacco smoker: Yes     Systolic Blood Pressure: 92 mmHg     Is BP treated: No     HDL Cholesterol: 38.9 mg/dL     Total Cholesterol: 140 mg/dL      BP Readings from Last 3 Encounters:  06/12/21 (!) 92/56  01/29/21 118/70  05/24/20 118/70    Care Plan  No Known Allergies  Medications Reviewed Today     Reviewed by Dale Burien, MD (Physician) on 07/15/21 at 1515  Med List Status: <None>   Medication Order Taking? Sig Documenting Provider Last Dose Status Informant  ertugliflozin L-PyroglutamicAc (STEGLATRO) 5 MG TABS tablet [id number] No Take 1 tablet (5 mg total) by mouth daily. Dale [place], MD Taking Active   loratadine (CLARITIN) 10 MG tablet [id number] No Take 10 mg by mouth daily. [provider] Taking Active Self  rosuvastatin (CRESTOR) 40 MG tablet [id number]  TAKE 1 TABLET(40 MG) BY MOUTH DAILY Dale , MD  Active   vitamin B-12 (CYANOCOBALAMIN) 100 MCG tablet [id number] No Take 100 mcg by mouth daily. [provider] Taking Active             Patient Active Problem List   Diagnosis Date Noted   Stress 07/17/2021   Back pain 06/12/2021   Vaccine counseling 02/02/2021   Thrombocytopenia (HCC) 01/29/2021   Fatigue 05/25/2020   Colon cancer screening 05/25/2020   Decreased hearing 12/09/2019   Sinusitis  05/03/2019   COVID-19 virus detected 04/23/2019   Hypercholesterolemia 02/05/2019   Headache 02/02/2016   Health care maintenance 09/29/2015   Lateral epicondylitis 08/19/2015   Right elbow pain 08/11/2015   Left shoulder pain 08/11/2015   Environmental allergies 08/11/2015   Diabetes mellitus (HCC) 08/09/2015    Conditions to be addressed/monitored: HLD and DMII  Care Plan : Medication Management  Updates made by Lourena Simmonds, RPH-CPP since 07/18/2021 12:00 AM     Problem: Diabetes, HLD      Long-Range Goal: Disease Progression Prevention   Start Date:  12/11/2020  Recent Progress: On track  Priority: High  Note:   Current Barriers:  Unable to independently afford treatment regimen Unable to achieve control of diabetes   Pharmacist Clinical Goal(s):  Over the next 90 days, patient will achieve control of diabetes as evidenced by A1c  through collaboration with PharmD and provider.   Interventions: 1:1 collaboration with Dale [place], MD regarding development and update of comprehensive plan of care as evidenced by provider attestation and co-signature Inter-disciplinary care team collaboration (see longitudinal plan of care) Comprehensive medication review performed; medication list updated in electronic medical record  Diabetes: Uncontrolled; current treatment: Steglatro 5 mg daily Hx Jardiance/Steglatro - never took, patient has a high deductible plan and copay was unaffordable Hx metformin - stomach cramping, fatigue Reports tolerating Steglatro well Current glucose readings: fastings/mid-afternoons are 130-160s, no change since prior to [facility] Discussed options of increasing Steglatro to 15 mg daily vs seeing if [facility] with the manufacturer savings card is going to be affordable for him. Counseled on GLP1 agonists, including mechanism of action, side effects, and benefits. No personal or family history of medullary thyroid cancer, personal history of pancreatitis or gallbladder disease. Counseled on potential side effects of nausea, stomach upset, queasiness, constipation, and that these generally improve over time. Advised to contact our office with more severe symptoms, including nausea, diarrhea, stomach pain. Patient verbalized understanding. Sent script for Mounjaro 2.5 mg weekly to pharmacy. Sent Mounjaro website URL to patient to sign up for a savings card. He will investigate this over the weekend and let me know what he finds out. If able to pick up [facility], stop Steglatro.   Hyperlipidemia: Improved; current  treatment: rosuvastatin 40 mg daily Recommended to continue current regimen at this time  Anxiety: Work related stressors; current regimen: buspirone 10 mg BID PRN Hx bupropion with no benefit per chart review.  Recommended to continue current regimen at this time  Patient Goals/Self-Care Activities Over the next 90 days, patient will:  - take medications as prescribed check glucose daily, document, and provide at future appointments  Follow Up Plan: Telephone follow up appointment with care management team member scheduled for: ~5 days     Medication Assistance:  High deductible insurance plan. Working through cost concerns  Follow Up:  Patient agrees to Care Plan and Follow-up.  Plan: Telephone follow up appointment with care management team member scheduled for:  ~5 days  [name], PharmD, [place], CPP Clinical Pharmacist [facility] at [employer] [phone]

## 2021-08-06 ENCOUNTER — Ambulatory Visit
Admission: EM | Admit: 2021-08-06 | Discharge: 2021-08-06 | Disposition: A | Discharge status: Home / Self Care | Payer: BC Managed Care – PPO | Attending: Physician Assistant | Admitting: Physician Assistant

## 2021-08-06 ENCOUNTER — Other Ambulatory Visit: Payer: Self-pay

## 2021-08-06 DIAGNOSIS — M71121 Other infective bursitis, right elbow: Secondary | ICD-10-CM | POA: Diagnosis not present

## 2021-08-06 DIAGNOSIS — M7021 Olecranon bursitis, right elbow: Secondary | ICD-10-CM | POA: Diagnosis not present

## 2021-08-06 MED ORDER — CEFTRIAXONE SODIUM 1 G IJ SOLR
1.0000 g | Freq: Once | INTRAMUSCULAR | Status: AC
Start: 2021-08-06 — End: 2021-08-06
  Administered 2021-08-06: 1 g via INTRAMUSCULAR

## 2021-08-06 MED ORDER — MELOXICAM 15 MG PO TABS: 15.0000 mg | ORAL_TABLET | Freq: Every day | ORAL | 0 refills | Status: AC

## 2021-08-06 MED ORDER — SULFAMETHOXAZOLE-TRIMETHOPRIM 800-160 MG PO TABS: 1.0000 | ORAL_TABLET | Freq: Two times a day (BID) | ORAL | 0 refills | Status: AC

## 2021-08-06 NOTE — Discharge Instructions (Addendum)
-  You have a condition called olecranon bursitis or bursitis of the elbow.  Given that you had a small cut in this area before the onset of redness, swelling and warmth, I suspect that this is infectious instead of inflammatory but as we discussed you can have both conditions.  I have sent an antibiotic for you start and you have been given an antibiotic in the clinic today.  I have also sent an anti-inflammatory.  I would advise that you ice the elbow and keep it elevated and wear compression wrap.  He can have Tylenol for pain relief.  Keep a close eye in the area and if it is not improving over the next couple of days or if it worsens or you develop a fever you need to be seen again.  For any acute worsening of symptoms or inability to move the elbow without significant pain you should go to the emergency department. -Try to follow up with PCP in 2 days for re-check

## 2021-08-06 NOTE — ED Triage Notes (Signed)
Pt presents with intermittent R elbow swelling, redness, warmth x 5 days.  Pt states there was initially a very small scab on elbow (1/8th inch) but it has fallen off.  No scab, wound noted during triage.  Pt states he didn't think it looked like a bug bite and has no known injury.  Has tried ice and ibuprofen with no relief.  No h/o gout, RA, etc.    Pt states this started 1 week after beginning new medicine.

## 2021-08-06 NOTE — ED Provider Notes (Signed)
[facility]    CSN: [id number] Arrival date & time: 08/06/21  1508      History   Chief Complaint Chief Complaint  Patient presents with   Joint Swelling    R     HPI Edward Spencer is a 50 y.o. male presenting for six 5-day history of redness, swelling, warmth and pain of the right elbow.  Patient says he did notice a cut on the elbow with the day before he noticed the redness, swelling and pain. He has occasionally taken ibuprofen and Tylenol for discomfort.  He has also iced the area occasionally.  Patient says he rests his forearms and elbows at the computer a lot and is unsure if that contributes to the swelling.  He denies any specific injury and is not sure how he got this cut/scab.  Patient denies any fever or decrease in ROM of elbow. He does have pain with full flexion of elbow.  No history of major injury or arthritis of the elbow.  No other complaints.  HPI  Past Medical History:  Diagnosis Date   Allergy    Arthritis    Asthma    Depression    Diabetes mellitus without complication (HCC)    History of chicken pox     Patient Active Problem List   Diagnosis Date Noted   Stress 07/17/2021   Back pain 06/12/2021   Vaccine counseling 02/02/2021   Thrombocytopenia (HCC) 01/29/2021   Fatigue 05/25/2020   Colon cancer screening 05/25/2020   Decreased hearing 12/09/2019   Sinusitis 05/03/2019   COVID-19 virus detected 04/23/2019   Hypercholesterolemia 02/05/2019   Headache 02/02/2016   Health care maintenance 09/29/2015   Lateral epicondylitis 08/19/2015   Right elbow pain 08/11/2015   Left shoulder pain 08/11/2015   Environmental allergies 08/11/2015   Diabetes mellitus (HCC) 08/09/2015    History reviewed. No pertinent surgical history.     Home Medications    Prior to Admission medications   Medication Sig Start Date End Date Taking? Authorizing Provider  busPIRone (BUSPAR) 10 MG tablet Take 1 tablet (10 mg total) by mouth 2 (two)  times daily. 07/17/21  Yes Dale [place], MD  loratadine (CLARITIN) 10 MG tablet Take 10 mg by mouth daily.   Yes [provider]  meloxicam (MOBIC) 15 MG tablet Take 1 tablet (15 mg total) by mouth daily for 10 days. 08/06/21 08/16/21 Yes Eusebio Friendly B, PA-C  rosuvastatin (CRESTOR) 40 MG tablet TAKE 1 TABLET(40 MG) BY MOUTH DAILY 06/23/21  Yes Dale [place], MD  sulfamethoxazole-trimethoprim (BACTRIM DS) 800-160 MG tablet Take 1 tablet by mouth 2 (two) times daily for 10 days. 08/06/21 08/16/21 Yes Shirlee Latch, PA-C  tirzepatide [facility]) 2.5 MG/0.5ML Pen Inject 2.5 mg into the skin once a week. 07/18/21  Yes Dale [place], MD  vitamin B-12 (CYANOCOBALAMIN) 100 MCG tablet Take 100 mcg by mouth daily.   Yes [provider]  ertugliflozin L-PyroglutamicAc (STEGLATRO) 5 MG TABS tablet Take 1 tablet (5 mg total) by mouth daily. 05/09/21   Dale [place], MD    Family History Family History  Problem Relation Age of Onset   Arthritis Mother    Hyperlipidemia Mother    Arthritis Father    Hypertension Father    Diabetes Father    Mental illness Maternal Aunt    Hyperlipidemia Maternal Aunt    Alcohol abuse Maternal Aunt    Mental illness Paternal Aunt    Alcohol abuse Paternal Aunt  Arthritis Paternal Uncle    Arthritis Maternal Grandmother    Arthritis Paternal Grandmother    Hyperlipidemia Paternal Grandmother    Diabetes Paternal Grandmother     Social History Social History   Tobacco Use   Smoking status: Every Day    Packs/day: 0.25    Years: 40.00    Pack years: 10.00    Types: Cigarettes   Smokeless tobacco: Never  Vaping Use   Vaping Use: Never used  Substance Use Topics   Alcohol use: Yes    Alcohol/week: 0.0 standard drinks    Comment: social   Drug use: No     Allergies   Patient has no known allergies.   Review of Systems Review of Systems  Constitutional:  Negative for fatigue and fever.  Musculoskeletal:  Positive for arthralgias  and joint swelling.  Skin:  Positive for color change and wound.  Neurological:  Negative for weakness and numbness.    Physical Exam Triage Vital Signs ED Triage Vitals  Enc Vitals Group     BP 08/06/21 1533 (!) 146/92     Pulse Rate 08/06/21 1533 75     Resp 08/06/21 1533 18     Temp 08/06/21 1533 98.6 F (37 C)     Temp Source 08/06/21 1533 Oral     SpO2 08/06/21 1533 98 %     Weight --      Height --      Head Circumference --      Peak Flow --      Pain Score 08/06/21 1536 7     Pain Loc --      Pain Edu? --      Excl. in GC? --    No data found.  Updated Vital Signs BP (!) 146/92 (BP Location: Right Arm)   Pulse 75   Temp 98.6 F (37 C) (Oral)   Resp 18   SpO2 98%      Physical Exam Vitals and nursing note reviewed.  Constitutional:      Appearance: He is well-developed.  HENT:     Head: Normocephalic and atraumatic.  Eyes:     General: No scleral icterus.    Conjunctiva/sclera: Conjunctivae normal.  Cardiovascular:     Rate and Rhythm: Normal rate and regular rhythm.     Pulses: Normal pulses.  Pulmonary:     Effort: Pulmonary effort is normal. No respiratory distress.  Musculoskeletal:     Cervical back: Neck supple.     Comments: RIGHT ELBOW: Moderate swelling over olecranon with TTP. Very small pinpoint black dot (where patient says he had a scab). Area is erythematous and warm. Full ROM of elbow but has pain with full flexion.  Skin:    General: Skin is warm and dry.  Neurological:     General: No focal deficit present.     Mental Status: He is alert. Mental status is at baseline.     Motor: No weakness.     Gait: Gait normal.  Psychiatric:        Mood and Affect: Mood normal.        Behavior: Behavior normal.        Thought Content: Thought content normal.      UC Treatments / Results  Labs (all labs ordered are listed, but only abnormal results are displayed) Labs Reviewed - No data to display  EKG   Radiology No results  found.  Procedures Procedures (including critical care time)  Medications Ordered in  UC Medications  cefTRIAXone (ROCEPHIN) injection 1 g (1 g Intramuscular Given 08/06/21 1636)    Initial Impression / Assessment and Plan / UC Course  I have reviewed the triage vital signs and the nursing notes.  Pertinent labs & imaging results that were available during my care of the patient were reviewed by me and considered in my medical decision making (see chart for details).  50 year old male presenting for redness, swelling, warmth and pain of the right elbow.  No known injury but he did have a small laceration/scab the day before onset of symptoms.  He has not had any fevers and has full range of motion of elbow but does have pain with full flexion.  He is afebrile.  He is well-appearing. Clinical presentation consistent with olecranon bursitis, suspect likely infection origin. Patient given 1 g rocephin and sent Bactrim DS and meloxicam to pharmacy. Also advised following RICE guidelines.  Advise close monitoring and follow-up with PCP in 2 days if possible.  Reviewed ED precautions thoroughly with patient.  Work note given.  Final Clinical Impressions(s) / UC Diagnoses   Final diagnoses:  Olecranon bursitis of right elbow  Septic bursitis of elbow, right     Discharge Instructions      -You have a condition called olecranon bursitis or bursitis of the elbow.  Given that you had a small cut in this area before the onset of redness, swelling and warmth, I suspect that this is infectious instead of inflammatory but as we discussed you can have both conditions.  I have sent an antibiotic for you start and you have been given an antibiotic in the clinic today.  I have also sent an anti-inflammatory.  I would advise that you ice the elbow and keep it elevated and wear compression wrap.  He can have Tylenol for pain relief.  Keep a close eye in the area and if it is not improving over the next couple  of days or if it worsens or you develop a fever you need to be seen again.  For any acute worsening of symptoms or inability to move the elbow without significant pain you should go to the emergency department. -Try to follow up with PCP in 2 days for re-check     ED Prescriptions     Medication Sig Dispense Auth. Provider   sulfamethoxazole-trimethoprim (BACTRIM DS) 800-160 MG tablet Take 1 tablet by mouth 2 (two) times daily for 10 days. 20 tablet Eusebio Friendly B, PA-C   meloxicam (MOBIC) 15 MG tablet Take 1 tablet (15 mg total) by mouth daily for 10 days. 10 tablet Gareth Radwan      PDMP not reviewed this encounter.   Shirlee Latch, PA-C 08/06/21 1642

## 2021-08-12 ENCOUNTER — Other Ambulatory Visit: Payer: Self-pay | Admitting: Internal Medicine

## 2021-08-12 DIAGNOSIS — E1165 Type 2 diabetes mellitus with hyperglycemia: Secondary | ICD-10-CM

## 2021-08-13 ENCOUNTER — Other Ambulatory Visit: Payer: Self-pay | Admitting: Internal Medicine

## 2021-08-13 DIAGNOSIS — E1165 Type 2 diabetes mellitus with hyperglycemia: Secondary | ICD-10-CM

## 2021-08-25 ENCOUNTER — Telehealth: Payer: Self-pay

## 2021-08-25 ENCOUNTER — Ambulatory Visit: Payer: BC Managed Care – PPO | Admitting: Pharmacist

## 2021-08-25 DIAGNOSIS — E7800 Pure hypercholesterolemia, unspecified: Secondary | ICD-10-CM

## 2021-08-25 DIAGNOSIS — E1165 Type 2 diabetes mellitus with hyperglycemia: Secondary | ICD-10-CM

## 2021-08-25 NOTE — Telephone Encounter (Signed)
Spoke with patient, see CCM documentation

## 2021-08-25 NOTE — Patient Instructions (Signed)
Visit Information   Goals Addressed               This Visit's Progress     Patient Stated     Medication Monitoring (pt-stated)        Patient Goals/Self-Care Activities Over the next 90 days, patient will:  - take medications as prescribed check glucose daily, document, and provide at future appointments         Patient verbalizes understanding of instructions provided today and agrees to view in MyChart.  Plan: Telephone follow up appointment with care management team member scheduled for:  4 weeks  [name], PharmD, [place], CPP Clinical Pharmacist [facility] at [employer] [phone]

## 2021-08-25 NOTE — Telephone Encounter (Signed)
Patient called t say that his insurance will not cover Mounjaro without a PA. They will only approve metformin. Please read message below.

## 2021-08-25 NOTE — Chronic Care Management (AMB) (Signed)
Care Management   Pharmacy Note  08/25/2021 Name: Edward Spencer MRN: [id number] DOB: 01-23-1971  Subjective: Edward Spencer is a 50 y.o. year old male who is a primary care patient of Dale [place]ron, MD. The Care Management team was consulted for assistance with care management and care coordination needs.    Engaged with patient by telephone for  medication access  in response to provider referral for pharmacy case management and/or care coordination services.   The patient was given information about Care Management services today including:  Care Management services includes personalized support from designated clinical staff supervised by the patient's primary care provider, including individualized plan of care and coordination with other care providers. 24/7 contact phone numbers for assistance for urgent and routine care needs. The patient may stop case management services at any time by phone call to the office staff.  Patient agreed to services and consent obtained.  Assessment:  Review of patient status, including review of consultants reports, laboratory and other test data, was performed as part of comprehensive evaluation and provision of chronic care management services.   SDOH (Social Determinants of Health) assessments and interventions performed:    Objective:  Lab Results  Component Value Date   CREATININE 0.99 06/23/2021   CREATININE 0.99 01/29/2021   CREATININE 1.04 09/19/2020    Lab Results  Component Value Date   HGBA1C 7.7 (H) 06/23/2021       Component Value Date/Time   CHOL 140 06/23/2021 0808   TRIG 128.0 06/23/2021 0808   HDL 38.90 (L) 06/23/2021 0808   CHOLHDL 4 06/23/2021 0808   VLDL 25.6 06/23/2021 0808   LDLCALC 76 06/23/2021 0808   LDLCALC 151 (H) 12/30/2018 0846     BP Readings from Last 3 Encounters:  08/06/21 (!) 146/92  06/12/21 (!) 92/56  01/29/21 118/70    Care Plan  No Known Allergies  Medications Reviewed Today      Reviewed by Rosalee Kaufman, RN (Registered Nurse) on 08/06/21 at 1538  Med List Status: <None>   Medication Order Taking? Sig Documenting Provider Last Dose Status Informant  busPIRone (BUSPAR) 10 MG tablet [id number] Yes Take 1 tablet (10 mg total) by mouth 2 (two) times daily. Dale [place], MD  Consider Medication Status and Discontinue (Patient Preference)   ertugliflozin L-PyroglutamicAc (STEGLATRO) 5 MG TABS tablet [id number]  Take 1 tablet (5 mg total) by mouth daily. Dale [place], MD  Consider Medication Status and Discontinue (Completed Course)   loratadine (CLARITIN) 10 MG tablet [id number] Yes Take 10 mg by mouth daily. [provider]  Active Self  rosuvastatin (CRESTOR) 40 MG tablet [id number] Yes TAKE 1 TABLET(40 MG) BY MOUTH DAILY Dale [place], MD  Active   tirzepatide [facility]) 2.5 MG/0.5ML Pen [id number] Yes Inject 2.5 mg into the skin once a week. Dale [place], MD  Active   vitamin B-12 (CYANOCOBALAMIN) 100 MCG tablet [id number] Yes Take 100 mcg by mouth daily. [provider]  Active             Patient Active Problem List   Diagnosis Date Noted   Stress 07/17/2021   Back pain 06/12/2021   Vaccine counseling 02/02/2021   Thrombocytopenia (HCC) 01/29/2021   Fatigue 05/25/2020   Colon cancer screening 05/25/2020   Decreased hearing 12/09/2019   Sinusitis 05/03/2019   COVID-19 virus detected 04/23/2019   Hypercholesterolemia 02/05/2019   Headache 02/02/2016   Health care maintenance 09/29/2015   Lateral epicondylitis 08/19/2015   Right elbow pain 08/11/2015  Left shoulder pain 08/11/2015   Environmental allergies 08/11/2015   Diabetes mellitus (HCC) 08/09/2015    Conditions to be addressed/monitored: HLD and DMII  Care Plan : Medication Management  Updates made by Lourena Simmonds, RPH-CPP since 08/25/2021 12:00 AM     Problem: Diabetes, HLD      Long-Range Goal: Disease Progression Prevention   Start Date: 12/11/2020   This Visit's Progress: On track  Recent Progress: On track  Priority: High  Note:   Current Barriers:  Unable to independently afford treatment regimen Unable to achieve control of diabetes   Pharmacist Clinical Goal(s):  Over the next 90 days, patient will achieve control of diabetes as evidenced by A1c  through collaboration with PharmD and provider.   Interventions: 1:1 collaboration with Dale [place], MD regarding development and update of comprehensive plan of care as evidenced by provider attestation and co-signature Inter-disciplinary care team collaboration (see longitudinal plan of care) Comprehensive medication review performed; medication list updated in electronic medical record  Diabetes: Uncontrolled; current treatment: Mounjaro 2.5 mg weekly x 4 weeks Hx Jardiance/Steglatro - never took, patient has a high deductible plan and copay was unaffordable Hx metformin - stomach cramping, fatigue Reports improvement in blood sugars, but reports fatigue, especially after eating.  Current glucose readings: reports average now in ~ 140s, which is improved.  Called to report that the refill was not going through, but he thinks he has gotten the savings card straightened out with the pharmacy. Given fatigue with current dose, will plan to continue 2.5 mg weekly for another month. Patient verbalized understanding.   Hyperlipidemia: Improved; current treatment: rosuvastatin 40 mg daily Previously recommended to continue current regimen at this time  Anxiety: Work related stressors; current regimen: buspirone 10 mg BID PRN Hx bupropion with no benefit per chart review.  Previously recommended to continue current regimen at this time  Patient Goals/Self-Care Activities Over the next 90 days, patient will:  - take medications as prescribed check glucose daily, document, and provide at future appointments  Follow Up Plan: Telephone follow up appointment with care management  team member scheduled for: ~4 weeks     Medication Assistance:  None required.  Patient affirms current coverage meets needs.  Follow Up:  Patient agrees to Care Plan and Follow-up.  Plan: Telephone follow up appointment with care management team member scheduled for:  4 weeks  [name], PharmD, [place], CPP Clinical Pharmacist [facility] at [employer] [phone]

## 2021-09-26 ENCOUNTER — Ambulatory Visit: Payer: BC Managed Care – PPO | Admitting: Pharmacist

## 2021-09-26 DIAGNOSIS — E1165 Type 2 diabetes mellitus with hyperglycemia: Secondary | ICD-10-CM

## 2021-09-26 DIAGNOSIS — E7800 Pure hypercholesterolemia, unspecified: Secondary | ICD-10-CM

## 2021-09-26 MED ORDER — TIRZEPATIDE 5 MG/0.5ML ~~LOC~~ SOAJ: 5.0000 mg | SUBCUTANEOUS | 2 refills | Status: DC

## 2021-09-26 MED ORDER — ROSUVASTATIN CALCIUM 40 MG PO TABS: 40.0000 mg | ORAL_TABLET | Freq: Every day | ORAL | 1 refills | Status: DC

## 2021-09-26 NOTE — Patient Instructions (Signed)
Visit Information  (Patient Goals/Self-Care Activities Over the next 90 days, patient will:  - take medications as prescribed check glucose daily, document, and provide at future appointments   Patient verbalizes understanding of instructions provided today and agrees to view in MyChart.   Plan: Telephone follow up appointment with care management team member scheduled for:  8 weeks  [name], PharmD, [place], CPP Clinical Pharmacist [employer] at [employer] [phone]

## 2021-09-26 NOTE — Chronic Care Management (AMB) (Signed)
Chronic Care Management CCM Pharmacy Note  09/26/2021 Name:  Edward Spencer MRN:  [id number] DOB:  Feb 12, 1971  Summary: - Tolerating Mounjaro 2.5 mg weekly. Feels benefit is wearing off at the end of the week  Recommendations/Changes made from today's visit: - Increase Mounjaro to 5 mg weekly  Subjective: Edward Spencer is an 50 y.o. year old male who is a primary patient of Dale [place], MD.  The CCM team was consulted for assistance with disease management and care coordination needs.    Engaged with patient by telephone for follow up visit for pharmacy case management and/or care coordination services.   Objective:  Medications Reviewed Today     Reviewed by Rosalee Kaufman, RN (Registered Nurse) on 08/06/21 at 1538  Med List Status: <None>   Medication Order Taking? Sig Documenting Provider Last Dose Status Informant  busPIRone (BUSPAR) 10 MG tablet [id number] Yes Take 1 tablet (10 mg total) by mouth 2 (two) times daily. Dale [place], MD  Consider Medication Status and Discontinue (Patient Preference)   ertugliflozin L-PyroglutamicAc (STEGLATRO) 5 MG TABS tablet [id number]  Take 1 tablet (5 mg total) by mouth daily. Dale [place], MD  Consider Medication Status and Discontinue (Completed Course)   loratadine (CLARITIN) 10 MG tablet [id number] Yes Take 10 mg by mouth daily. [provider]  Active Self  rosuvastatin (CRESTOR) 40 MG tablet [id number] Yes TAKE 1 TABLET(40 MG) BY MOUTH DAILY Dale [place], MD  Active   tirzepatide [facility]) 2.5 MG/0.5ML Pen [id number] Yes Inject 2.5 mg into the skin once a week. Dale [place], MD  Active   vitamin B-12 (CYANOCOBALAMIN) 100 MCG tablet [id number] Yes Take 100 mcg by mouth daily. [provider]  Active             Pertinent Labs:   Lab Results  Component Value Date   HGBA1C 7.7 (H) 06/23/2021   Lab Results  Component Value Date   CHOL 140 06/23/2021   HDL 38.90 (L) 06/23/2021   LDLCALC 76  06/23/2021   TRIG 128.0 06/23/2021   CHOLHDL 4 06/23/2021   Lab Results  Component Value Date   CREATININE 0.99 06/23/2021   BUN 21 06/23/2021   NA 139 06/23/2021   K 4.8 06/23/2021   CL 103 06/23/2021   CO2 30 06/23/2021    SDOH:  (Social Determinants of Health) assessments and interventions performed:  SDOH Interventions    Flowsheet Row Most Recent Value  SDOH Interventions   Financial Strain Interventions Intervention Not Indicated       CCM Care Plan  Review of patient past medical history, allergies, medications, health status, including review of consultants reports, laboratory and other test data, was performed as part of comprehensive evaluation and provision of chronic care management services.   Care Plan : Medication Management  Updates made by Lourena Simmonds, RPH-CPP since 09/26/2021 12:00 AM     Problem: Diabetes, HLD      Long-Range Goal: Disease Progression Prevention   Start Date: 12/11/2020  Recent Progress: On track  Priority: High  Note:   Current Barriers:  Unable to independently afford treatment regimen Unable to achieve control of diabetes   Pharmacist Clinical Goal(s):  Over the next 90 days, patient will achieve control of diabetes as evidenced by A1c  through collaboration with PharmD and provider.   Interventions: 1:1 collaboration with Dale [place], MD regarding development and update of comprehensive plan of care as evidenced by provider attestation and co-signature Inter-disciplinary care team collaboration (see  longitudinal plan of care) Comprehensive medication review performed; medication list updated in electronic medical record  Diabetes: Uncontrolled; current treatment: Mounjaro 2.5 mg weekly  Denies GI upset, nausea, vomiting. Feels like the medication is wearing off at the end of the week Hx Jardiance/Steglatro - never took, patient has a high deductible plan and copay was unaffordable Hx metformin - stomach  cramping, fatigue Current glucose readings: has not been checking lately.  Reviewed goal A1c, goal fasting, and goal 2 hour post prandial glucose.  Discussed increasing Mounjaro to 5 mg weekly. Patient amenable. Script sent to pharmacy today.  Hyperlipidemia: Improved; current treatment: rosuvastatin 40 mg daily Requests refill. Sent today Previously recommended to continue current regimen at this time  Anxiety: Work related stressors; current regimen: buspirone 10 mg BID PRN Hx bupropion with no benefit per chart review.  Previously recommended to continue current regimen at this time  Patient Goals/Self-Care Activities Over the next 90 days, patient will:  - take medications as prescribed check glucose daily, document, and provide at future appointments       Plan: Telephone follow up appointment with care management team member scheduled for:  8 weeks  [name], PharmD, [place], CPP Clinical Pharmacist [facility] at [employer] [phone]

## 2021-10-07 DIAGNOSIS — H90A22 Sensorineural hearing loss, unilateral, left ear, with restricted hearing on the contralateral side: Secondary | ICD-10-CM | POA: Diagnosis not present

## 2021-10-07 DIAGNOSIS — H90A21 Sensorineural hearing loss, unilateral, right ear, with restricted hearing on the contralateral side: Secondary | ICD-10-CM | POA: Diagnosis not present

## 2021-10-13 ENCOUNTER — Ambulatory Visit (INDEPENDENT_AMBULATORY_CARE_PROVIDER_SITE_OTHER): Payer: BC Managed Care – PPO | Admitting: Internal Medicine

## 2021-10-13 ENCOUNTER — Other Ambulatory Visit: Payer: Self-pay

## 2021-10-13 VITALS — BP 132/76 | HR 72 | Temp 97.9°F | Resp 16 | Ht 73.0 in | Wt 253.0 lb

## 2021-10-13 DIAGNOSIS — E1165 Type 2 diabetes mellitus with hyperglycemia: Secondary | ICD-10-CM | POA: Diagnosis not present

## 2021-10-13 DIAGNOSIS — F439 Reaction to severe stress, unspecified: Secondary | ICD-10-CM | POA: Diagnosis not present

## 2021-10-13 DIAGNOSIS — D696 Thrombocytopenia, unspecified: Secondary | ICD-10-CM | POA: Diagnosis not present

## 2021-10-13 DIAGNOSIS — R5383 Other fatigue: Secondary | ICD-10-CM

## 2021-10-13 DIAGNOSIS — E7800 Pure hypercholesterolemia, unspecified: Secondary | ICD-10-CM

## 2021-10-13 DIAGNOSIS — Z125 Encounter for screening for malignant neoplasm of prostate: Secondary | ICD-10-CM

## 2021-10-13 NOTE — Progress Notes (Signed)
Patient ID: Edward Spencer, male   DOB: [birth year], 50 y.o.   MRN: [id number]   Subjective:    Patient ID: Edward Spencer, male    DOB: [birth year], 50 y.o.   MRN: [id number]  This visit occurred during the SARS-CoV-2 public health emergency.  Safety protocols were in place, including screening questions prior to the visit, additional usage of staff PPE, and extensive cleaning of exam room while observing appropriate contact time as indicated for disinfecting solutions.   Patient here for a scheduled follow up.    Chief Complaint  Patient presents with   Diabetes   Hyperlipidemia   .   HPI Still with increased stress. Work stress.  Discussed.  Intolerant to buspar.  Not taking.  Desires not to take anything at this time.  Is tolerating mounjaro.  Fasting sugars 130s.  Midday sugars - 120s.  No chest pain or sob reported.  No abdominal pain or bowel change reported.  Does report increased fatigue.  Daytime somnolence.     Past Medical History:  Diagnosis Date   Allergy    Arthritis    Asthma    Depression    Diabetes mellitus without complication ([place])    History of chicken pox    History reviewed. No pertinent surgical history. Family History  Problem Relation Age of Onset   Arthritis Mother    Hyperlipidemia Mother    Arthritis Father    Hypertension Father    Diabetes Father    Mental illness Maternal Aunt    Hyperlipidemia Maternal Aunt    Alcohol abuse Maternal Aunt    Mental illness Paternal Aunt    Alcohol abuse Paternal Aunt    Arthritis Paternal Uncle    Arthritis Maternal Grandmother    Arthritis Paternal Grandmother    Hyperlipidemia Paternal Grandmother    Diabetes Paternal Grandmother    Social History   Socioeconomic History   Marital status: Single    Spouse name: Not on file   Number of children: Not on file   Years of education: Not on file   Highest education level: Not on file  Occupational History   Not on file  Tobacco Use   Smoking status:  Every Day    Packs/day: 0.25    Years: 40.00    Pack years: 10.00    Types: Cigarettes   Smokeless tobacco: Never  Vaping Use   Vaping Use: Never used  Substance and Sexual Activity   Alcohol use: Yes    Alcohol/week: 0.0 standard drinks    Comment: social   Drug use: No   Sexual activity: Not on file  Other Topics Concern   Not on file  Social History Narrative   Not on file   Social Determinants of Health   Financial Resource Strain: Low Risk    Difficulty of Paying Living Expenses: Not hard at all  Food Insecurity: Not on file  Transportation Needs: Not on file  Physical Activity: Not on file  Stress: Not on file  Social Connections: Not on file     Review of Systems  Constitutional:  Positive for fatigue. Negative for appetite change and unexpected weight change.  HENT:  Negative for congestion and sinus pressure.   Respiratory:  Negative for cough, chest tightness and shortness of breath.   Cardiovascular:  Negative for chest pain, palpitations and leg swelling.  Gastrointestinal:  Negative for abdominal pain, diarrhea, nausea and vomiting.  Genitourinary:  Negative for difficulty urinating and dysuria.  Musculoskeletal:  Negative for joint swelling and myalgias.  Neurological:  Negative for dizziness, light-headedness and headaches.  Psychiatric/Behavioral:  Negative for agitation and dysphoric mood.        Increased stress.       Objective:     BP 132/76   Pulse 72   Temp 97.9 F (36.6 C)   Resp 16   Ht _0  (1.854 m)   Wt 253 lb (114.8 kg)   SpO2 98%   BMI 33.38 kg/m  Wt Readings from Last 3 Encounters:  10/13/21 253 lb (114.8 kg)  07/15/21 246 lb (111.6 kg)  06/12/21 246 lb (111.6 kg)    Physical Exam Constitutional:      General: He is not in acute distress.    Appearance: Normal appearance. He is well-developed.  HENT:     Head: Normocephalic and atraumatic.     Right Ear: External ear normal.     Left Ear: External ear normal.  Eyes:      General: No scleral icterus.       Right eye: No discharge.        Left eye: No discharge.  Cardiovascular:     Rate and Rhythm: Normal rate and regular rhythm.  Pulmonary:     Effort: Pulmonary effort is normal. No respiratory distress.     Breath sounds: Normal breath sounds.  Abdominal:     General: Bowel sounds are normal.     Palpations: Abdomen is soft.     Tenderness: There is no abdominal tenderness.  Musculoskeletal:        General: No swelling or tenderness.     Cervical back: Neck supple. No tenderness.  Lymphadenopathy:     Cervical: No cervical adenopathy.  Skin:    Findings: No erythema or rash.  Neurological:     Mental Status: He is alert.  Psychiatric:        Mood and Affect: Mood normal.        Behavior: Behavior normal.     Outpatient Encounter Medications as of 10/13/2021  Medication Sig   loratadine (CLARITIN) 10 MG tablet Take 10 mg by mouth daily.   rosuvastatin (CRESTOR) 40 MG tablet Take 1 tablet (40 mg total) by mouth daily.   tirzepatide [facility]) 5 MG/0.5ML Pen Inject 5 mg into the skin once a week.   vitamin B-12 (CYANOCOBALAMIN) 100 MCG tablet Take 100 mcg by mouth daily.   [DISCONTINUED] busPIRone (BUSPAR) 10 MG tablet Take 1 tablet (10 mg total) by mouth 2 (two) times daily.   No facility-administered encounter medications on file as of 10/13/2021.     Lab Results  Component Value Date   WBC 5.5 06/23/2021   HGB 15.2 06/23/2021   HCT 45.8 06/23/2021   PLT 154.0 06/23/2021   GLUCOSE 165 (H) 06/23/2021   CHOL 140 06/23/2021   TRIG 128.0 06/23/2021   HDL 38.90 (L) 06/23/2021   LDLCALC 76 06/23/2021   ALT 16 06/23/2021   AST 14 06/23/2021   NA 139 06/23/2021   K 4.8 06/23/2021   CL 103 06/23/2021   CREATININE 0.99 06/23/2021   BUN 21 06/23/2021   CO2 30 06/23/2021   TSH 0.83 09/19/2020   PSA 0.30 09/19/2020   HGBA1C 7.7 (H) 06/23/2021   MICROALBUR <0.7 01/29/2021       Assessment & Plan:   Problem List Items Addressed  This Visit     Diabetes mellitus ([place])    On mounjaro.  Tolerating.  Sugars as outlined.  Follow  met b and a1c,       Relevant Orders   Hemoglobin R8Q   Basic metabolic panel   Fatigue    Increased fatigue and daytime somnolence.  Discussed possible sleep apnea.  Refer to pulmonary for evaluation for sleep apnea -  question of need for sleep study.        Hypercholesterolemia    On crestor.  Low cholesterol diet and exercise.  Follow lipid panel and liver function tests.        Relevant Orders   TSH   Lipid panel   Hepatic function panel   Stress    Increased stress.  Discussed.  Off buspar.  Desires no further intervention.  Follow.       Thrombocytopenia ([place])    Follow cbc.       Relevant Orders   CBC with Differential/Platelet   Other Visit Diagnoses     Prostate cancer screening    -  Primary   Relevant Orders   PSA        Einar Pheasant, MD

## 2021-10-14 ENCOUNTER — Other Ambulatory Visit: Payer: Self-pay | Admitting: Unknown Physician Specialty

## 2021-10-14 DIAGNOSIS — H90A22 Sensorineural hearing loss, unilateral, left ear, with restricted hearing on the contralateral side: Secondary | ICD-10-CM

## 2021-10-16 LAB — HM DIABETES EYE EXAM

## 2021-10-18 ENCOUNTER — Encounter: Payer: Self-pay | Admitting: Internal Medicine

## 2021-10-18 NOTE — Assessment & Plan Note (Signed)
Increased stress.  Discussed.  Off buspar.  Desires no further intervention.  Follow.

## 2021-10-18 NOTE — Assessment & Plan Note (Signed)
On mounjaro.  Tolerating.  Sugars as outlined.  Follow met b and a1c,

## 2021-10-18 NOTE — Assessment & Plan Note (Signed)
On crestor.  Low cholesterol diet and exercise.  Follow lipid panel and liver function tests.   

## 2021-10-18 NOTE — Assessment & Plan Note (Signed)
Follow cbc.  

## 2021-10-18 NOTE — Assessment & Plan Note (Signed)
Increased fatigue and daytime somnolence.  Discussed possible sleep apnea.  Refer to pulmonary for evaluation for sleep apnea -  question of need for sleep study.

## 2021-11-07 ENCOUNTER — Other Ambulatory Visit: Payer: BC Managed Care – PPO

## 2021-11-28 ENCOUNTER — Ambulatory Visit: Payer: BC Managed Care – PPO | Admitting: Pharmacist

## 2021-11-28 DIAGNOSIS — E1165 Type 2 diabetes mellitus with hyperglycemia: Secondary | ICD-10-CM

## 2021-11-28 MED ORDER — TIRZEPATIDE 2.5 MG/0.5ML ~~LOC~~ SOAJ: 2.5000 mg | SUBCUTANEOUS | 2 refills | Status: DC

## 2021-11-28 NOTE — Progress Notes (Signed)
Given he has not had labs since August, go ahead and leave the labs scheduled and I will review and see about changing appt.  Just leave scheduled as is for now.  Thank you.

## 2021-11-28 NOTE — Progress Notes (Signed)
° °   ° °  Chief Complaint  Patient presents with   Care Coordination    Follow Up    Edward Spencer is a 51 y.o. year old male who was referred for medication management by their primary care provider, Dale [place], MD. They presented for a telephone visit in the context of the COVID-19 pandemic.    Subjective: Diabetes:  Current medications: Mounjaro 5 mg weekly  Medications tried in the past: metformin - stomach cramping and fatigue  Reports that for the first 2-3 days after taking the [facility], he feels extremely fatigued.   Current glucose readings: none in the past week, but prior fastings 120-130s  Medication access: using [facility] manufacturer card. He reports he spoke with his insurance and they note the only medication they cover for diabetes is metformin.     Objective: Lab Results  Component Value Date   HGBA1C 7.7 (H) 06/23/2021    Lab Results  Component Value Date   CREATININE 0.99 06/23/2021   BUN 21 06/23/2021   NA 139 06/23/2021   K 4.8 06/23/2021   CL 103 06/23/2021   CO2 30 06/23/2021    Lab Results  Component Value Date   CHOL 140 06/23/2021   HDL 38.90 (L) 06/23/2021   LDLCALC 76 06/23/2021   TRIG 128.0 06/23/2021   CHOLHDL 4 06/23/2021    Medications Reviewed Today     Reviewed by Lourena Simmonds, RPH-CPP (Pharmacist) on 11/28/21 at 1534  Med List Status: <None>   Medication Order Taking? Sig Documenting Provider Last Dose Status Informant  loratadine (CLARITIN) 10 MG tablet [id number] Yes Take 10 mg by mouth daily. [provider] Taking Active Self  rosuvastatin (CRESTOR) 40 MG tablet [id number] Yes Take 1 tablet (40 mg total) by mouth daily. Dale [place], MD Taking Active   tirzepatide [facility]) 5 MG/0.5ML Pen [id number] Yes Inject 5 mg into the skin once a week. Dale [place], MD Taking Active   vitamin B-12 (CYANOCOBALAMIN) 100 MCG tablet [id number] Yes Take 100 mcg by mouth daily. [provider] Taking  Active             Assessment/Plan:   Diabetes: - Currently uncontrolled but improving - Reviewed goal A1c, goal fasting, and goal 2 hour post prandial glucose - Recommend to decrease Mounjaro to 2.5 mg weekly to see if better tolerated.  - Recommend to check glucose twice daily, fasting and post prandial  - Coordinated with PCP to reschedule fasting labs.     Follow Up Plan: video visit in 4 months  [name], PharmD, [place], CPP Clinical Pharmacist [facility] at [employer] [phone]

## 2021-11-28 NOTE — Patient Instructions (Signed)
Edward Spencer,   Let's see if the Mounjaro 2.5 mg dose is better tolerated.   Also - look into a Continuous Glucose Monitor called the FreeStyle Libre 3. It might be a useful tool fo ryou.   Take care!  [name], PharmD

## 2021-12-08 ENCOUNTER — Other Ambulatory Visit: Payer: Self-pay

## 2021-12-08 ENCOUNTER — Other Ambulatory Visit (INDEPENDENT_AMBULATORY_CARE_PROVIDER_SITE_OTHER): Payer: BC Managed Care – PPO

## 2021-12-08 DIAGNOSIS — E7800 Pure hypercholesterolemia, unspecified: Secondary | ICD-10-CM | POA: Diagnosis not present

## 2021-12-08 DIAGNOSIS — E1165 Type 2 diabetes mellitus with hyperglycemia: Secondary | ICD-10-CM

## 2021-12-08 DIAGNOSIS — Z125 Encounter for screening for malignant neoplasm of prostate: Secondary | ICD-10-CM

## 2021-12-08 DIAGNOSIS — D696 Thrombocytopenia, unspecified: Secondary | ICD-10-CM

## 2021-12-08 LAB — LIPID PANEL
Cholesterol: 114 mg/dL (ref 0–200)
HDL: 37.5 mg/dL — ABNORMAL LOW (ref >39.00)
LDL Cholesterol: 51 mg/dL (ref 0–99)
NonHDL: 76.24
Total CHOL/HDL Ratio: 3
Triglycerides: 128 mg/dL (ref 0.0–149.0)
VLDL: 25.6 mg/dL (ref 0.0–40.0)

## 2021-12-08 LAB — CBC WITH DIFFERENTIAL/PLATELET
Basophils Absolute: 0 10*3/uL (ref 0.0–0.1)
Basophils Relative: 0.3 % (ref 0.0–3.0)
Eosinophils Absolute: 0.1 10*3/uL (ref 0.0–0.7)
Eosinophils Relative: 1.6 % (ref 0.0–5.0)
HCT: 42.2 % (ref 39.0–52.0)
Hemoglobin: 14.5 g/dL (ref 13.0–17.0)
Lymphocytes Relative: 23.6 % (ref 12.0–46.0)
Lymphs Abs: 1.7 10*3/uL (ref 0.7–4.0)
MCHC: 34.3 g/dL (ref 30.0–36.0)
MCV: 90 fl (ref 78.0–100.0)
Monocytes Absolute: 0.7 10*3/uL (ref 0.1–1.0)
Monocytes Relative: 9.7 % (ref 3.0–12.0)
Neutro Abs: 4.7 10*3/uL (ref 1.4–7.7)
Neutrophils Relative %: 64.8 % (ref 43.0–77.0)
Platelets: 136 10*3/uL — ABNORMAL LOW (ref 150.0–400.0)
RBC: 4.69 Mil/uL (ref 4.22–5.81)
RDW: 12.8 % (ref 11.5–15.5)
WBC: 7.2 10*3/uL (ref 4.0–10.5)

## 2021-12-08 LAB — BASIC METABOLIC PANEL
BUN: 16 mg/dL (ref 6–23)
CO2: 29 mEq/L (ref 19–32)
Calcium: 9.5 mg/dL (ref 8.4–10.5)
Chloride: 99 mEq/L (ref 96–112)
Creatinine, Ser: 1.07 mg/dL (ref 0.40–1.50)
GFR: 80.13 mL/min (ref >60.00)
Glucose, Bld: 212 mg/dL — ABNORMAL HIGH (ref 70–99)
Potassium: 4.1 mEq/L (ref 3.5–5.1)
Sodium: 137 mEq/L (ref 135–145)

## 2021-12-08 LAB — TSH: TSH: 1.35 u[IU]/mL (ref 0.35–5.50)

## 2021-12-08 LAB — HEPATIC FUNCTION PANEL
ALT: 21 U/L (ref 0–53)
AST: 13 U/L (ref 0–37)
Albumin: 4.6 g/dL (ref 3.5–5.2)
Alkaline Phosphatase: 57 U/L (ref 39–117)
Bilirubin, Direct: 0.2 mg/dL (ref 0.0–0.3)
Total Bilirubin: 0.8 mg/dL (ref 0.2–1.2)
Total Protein: 7.2 g/dL (ref 6.0–8.3)

## 2021-12-08 LAB — PSA: PSA: 0.34 ng/mL (ref 0.10–4.00)

## 2021-12-08 LAB — HEMOGLOBIN A1C: Hgb A1c MFr Bld: 7.1 % — ABNORMAL HIGH (ref 4.6–6.5)

## 2021-12-09 ENCOUNTER — Other Ambulatory Visit: Payer: Self-pay | Admitting: Internal Medicine

## 2021-12-09 DIAGNOSIS — D696 Thrombocytopenia, unspecified: Secondary | ICD-10-CM

## 2021-12-09 NOTE — Progress Notes (Signed)
Order placed for f/u lab.   

## 2022-01-09 ENCOUNTER — Other Ambulatory Visit (INDEPENDENT_AMBULATORY_CARE_PROVIDER_SITE_OTHER): Payer: BC Managed Care – PPO

## 2022-01-09 ENCOUNTER — Other Ambulatory Visit: Payer: Self-pay

## 2022-01-09 DIAGNOSIS — D696 Thrombocytopenia, unspecified: Secondary | ICD-10-CM

## 2022-01-09 LAB — PLATELET COUNT: Platelets: 167 10*3/uL (ref 140–400)

## 2022-01-10 ENCOUNTER — Encounter: Payer: Self-pay | Admitting: Internal Medicine

## 2022-01-19 ENCOUNTER — Ambulatory Visit: Payer: Self-pay | Admitting: Pharmacist

## 2022-01-19 NOTE — Chronic Care Management (AMB) (Signed)
?  Chronic Care Management  ? ?Note ? ?01/19/2022 ?Name: Edward Spencer MRN: [id number] DOB: [birth year] ? ? ? ?Closing pharmacy CCM case at this time. Will collaborate with Care Guide to outreach to schedule follow up with RN CM. Patient has clinic contact information for future questions or concerns.  ? ?[name], PharmD, [place], CPP ?Clinical Pharmacist ?[employer] conservation officer at [employer] ?[phone] ? ?

## 2022-02-13 ENCOUNTER — Ambulatory Visit (INDEPENDENT_AMBULATORY_CARE_PROVIDER_SITE_OTHER): Payer: BC Managed Care – PPO | Admitting: Internal Medicine

## 2022-02-13 ENCOUNTER — Ambulatory Visit (INDEPENDENT_AMBULATORY_CARE_PROVIDER_SITE_OTHER): Payer: BC Managed Care – PPO

## 2022-02-13 VITALS — BP 128/70 | HR 79 | Temp 97.9°F | Resp 16 | Ht 73.0 in | Wt 248.0 lb

## 2022-02-13 DIAGNOSIS — F439 Reaction to severe stress, unspecified: Secondary | ICD-10-CM

## 2022-02-13 DIAGNOSIS — M542 Cervicalgia: Secondary | ICD-10-CM | POA: Insufficient documentation

## 2022-02-13 DIAGNOSIS — E1165 Type 2 diabetes mellitus with hyperglycemia: Secondary | ICD-10-CM | POA: Diagnosis not present

## 2022-02-13 DIAGNOSIS — Z1211 Encounter for screening for malignant neoplasm of colon: Secondary | ICD-10-CM

## 2022-02-13 DIAGNOSIS — Z1159 Encounter for screening for other viral diseases: Secondary | ICD-10-CM

## 2022-02-13 DIAGNOSIS — R5383 Other fatigue: Secondary | ICD-10-CM

## 2022-02-13 DIAGNOSIS — E7800 Pure hypercholesterolemia, unspecified: Secondary | ICD-10-CM

## 2022-02-13 DIAGNOSIS — E119 Type 2 diabetes mellitus without complications: Secondary | ICD-10-CM | POA: Diagnosis not present

## 2022-02-13 DIAGNOSIS — D696 Thrombocytopenia, unspecified: Secondary | ICD-10-CM

## 2022-02-13 DIAGNOSIS — Z114 Encounter for screening for human immunodeficiency virus [HIV]: Secondary | ICD-10-CM

## 2022-02-13 NOTE — Progress Notes (Signed)
Patient ID: Edward Spencer, male   DOB: January 24, 1971, 51 y.o.   MRN: [id number] ? ? ?Subjective:  ? ? Patient ID: Edward Spencer, male    DOB: 27-Aug-1971, 51 y.o.   MRN: [id number] ? ?This visit occurred during the SARS-CoV-2 public health emergency.  Safety protocols were in place, including screening questions prior to the visit, additional usage of staff PPE, and extensive cleaning of exam room while observing appropriate contact time as indicated for disinfecting solutions.  ? ?Patient here for a scheduled follow up.  ? ?Chief Complaint  ?Patient presents with  ? Shoulder Pain  ? Neck Pain  ? .  ? ?HPI ?Still with increased stress.  Discussed.  Work stress.  Increased neck and shoulder pain.  Present over the last month.  Taking ibuprofen and naproxen bid.  Occasionally - discomfort - shoot down shoulder.  Limited rom.  No headache reported.  No chest pain or sob reported.  No abdominal pain.  Discussed colonoscopy.  Prefers to hold.   ? ?Past Medical History:  ?Diagnosis Date  ? Allergy   ? Arthritis   ? Asthma   ? Depression   ? Diabetes mellitus without complication ([place])   ? History of chicken pox   ? ?History reviewed. No pertinent surgical history. ?Family History  ?Problem Relation Age of Onset  ? Arthritis Mother   ? Hyperlipidemia Mother   ? Arthritis Father   ? Hypertension Father   ? Diabetes Father   ? Mental illness Maternal Aunt   ? Hyperlipidemia Maternal Aunt   ? Alcohol abuse Maternal Aunt   ? Mental illness Paternal Aunt   ? Alcohol abuse Paternal Aunt   ? Arthritis Paternal Uncle   ? Arthritis Maternal Grandmother   ? Arthritis Paternal Grandmother   ? Hyperlipidemia Paternal Grandmother   ? Diabetes Paternal Grandmother   ? ?Social History  ? ?Socioeconomic History  ? Marital status: Single  ?  Spouse name: Not on file  ? Number of children: Not on file  ? Years of education: Not on file  ? Highest education level: Not on file  ?Occupational History  ? Not on file  ?Tobacco Use  ? Smoking status:  Every Day  ?  Packs/day: 0.25  ?  Years: 40.00  ?  Pack years: 10.00  ?  Types: Cigarettes  ? Smokeless tobacco: Never  ?Vaping Use  ? Vaping Use: Never used  ?Substance and Sexual Activity  ? Alcohol use: Yes  ?  Alcohol/week: 0.0 standard drinks  ?  Comment: social  ? Drug use: No  ? Sexual activity: Not on file  ?Other Topics Concern  ? Not on file  ?Social History Narrative  ? Not on file  ? ?Social Determinants of Health  ? ?Financial Resource Strain: Low Risk   ? Difficulty of Paying Living Expenses: Not hard at all  ?Food Insecurity: Not on file  ?Transportation Needs: Not on file  ?Physical Activity: Not on file  ?Stress: Not on file  ?Social Connections: Not on file  ? ? ? ?Review of Systems  ?Constitutional:  Negative for appetite change and unexpected weight change.  ?HENT:  Negative for congestion and sinus pressure.   ?Respiratory:  Negative for cough, chest tightness and shortness of breath.   ?Cardiovascular:  Negative for chest pain, palpitations and leg swelling.  ?Gastrointestinal:  Negative for abdominal pain, diarrhea, nausea and vomiting.  ?Genitourinary:  Negative for difficulty urinating and dysuria.  ?Musculoskeletal:  Positive for  neck pain. Negative for joint swelling and myalgias.  ?     Neck and shoulder pain as outlined.   ?Skin:  Negative for color change and rash.  ?Neurological:  Negative for dizziness, light-headedness and headaches.  ?Psychiatric/Behavioral:  Negative for agitation and dysphoric mood.   ? ?   ?Objective:  ?  ? ?BP 128/70   Pulse 79   Temp 97.9 ?F (36.6 ?C)   Resp 16   Ht '6\' 1"'  (1.854 m)   Wt 248 lb (112.5 kg)   SpO2 98%   BMI 32.72 kg/m?  ?Wt Readings from Last 3 Encounters:  ?02/13/22 248 lb (112.5 kg)  ?10/13/21 253 lb (114.8 kg)  ?07/15/21 246 lb (111.6 kg)  ? ? ?Physical Exam ?Constitutional:   ?   General: He is not in acute distress. ?   Appearance: Normal appearance. He is well-developed.  ?HENT:  ?   Head: Normocephalic and atraumatic.  ?   Right Ear:  External ear normal.  ?   Left Ear: External ear normal.  ?Eyes:  ?   General: No scleral icterus.    ?   Right eye: No discharge.     ?   Left eye: No discharge.  ?Cardiovascular:  ?   Rate and Rhythm: Normal rate and regular rhythm.  ?Pulmonary:  ?   Effort: Pulmonary effort is normal. No respiratory distress.  ?   Breath sounds: Normal breath sounds.  ?Abdominal:  ?   General: Bowel sounds are normal.  ?   Palpations: Abdomen is soft.  ?   Tenderness: There is no abdominal tenderness.  ?Musculoskeletal:     ?   General: No swelling or tenderness.  ?   Cervical back: Neck supple. No tenderness.  ?   Comments: Limited rom - increased discomfort - posterior neck/upper shoulder with rotation of head.    ?Lymphadenopathy:  ?   Cervical: No cervical adenopathy.  ?Skin: ?   Findings: No erythema or rash.  ?Neurological:  ?   Mental Status: He is alert.  ?Psychiatric:     ?   Mood and Affect: Mood normal.     ?   Behavior: Behavior normal.  ? ? ? ?Outpatient Encounter Medications as of 02/13/2022  ?Medication Sig  ? loratadine (CLARITIN) 10 MG tablet Take 10 mg by mouth daily.  ? rosuvastatin (CRESTOR) 40 MG tablet Take 1 tablet (40 mg total) by mouth daily.  ? tirzepatide [facility]) 2.5 MG/0.5ML Pen Inject 2.5 mg into the skin once a week.  ? tiZANidine (ZANAFLEX) 4 MG tablet Take 1 tablet (4 mg total) by mouth daily as needed for muscle spasms. Take in the evening. Do not take and go driving or operate machinery.  ? vitamin B-12 (CYANOCOBALAMIN) 100 MCG tablet Take 100 mcg by mouth daily.  ? ?No facility-administered encounter medications on file as of 02/13/2022.  ?  ? ?Lab Results  ?Component Value Date  ? WBC 7.2 12/08/2021  ? HGB 14.5 12/08/2021  ? HCT 42.2 12/08/2021  ? PLT 167 01/09/2022  ? GLUCOSE 212 (H) 12/08/2021  ? CHOL 114 12/08/2021  ? TRIG 128.0 12/08/2021  ? HDL 37.50 (L) 12/08/2021  ? LDLCALC 51 12/08/2021  ? ALT 21 12/08/2021  ? AST 13 12/08/2021  ? NA 137 12/08/2021  ? K 4.1 12/08/2021  ? CL 99  12/08/2021  ? CREATININE 1.07 12/08/2021  ? BUN 16 12/08/2021  ? CO2 29 12/08/2021  ? TSH 1.35 12/08/2021  ? PSA 0.34  12/08/2021  ? HGBA1C 7.1 (H) 12/08/2021  ? MICROALBUR <0.7 01/29/2021  ? ? ?   ?Assessment & Plan:  ? ?Problem List Items Addressed This Visit   ? ? Colon cancer screening  ?  Notify me when agreeable for colonoscopy.   ?  ?  ? Diabetes mellitus ([place]) - Primary  ?  On mounjaro.  Low carb diet and exercise.   Follow met b and a1c.  ?  ?  ? Relevant Orders  ? Hemoglobin A1c  ? Fatigue  ?  Had reported increased fatigue and daytime somnolence.  Have discussed possible sleep apnea.  Had agreed on referral to pulmonary for evaluation of question of sleep apnea and need for sleep study.   ?  ?  ? Relevant Orders  ? Ambulatory referral to Pulmonology  ? Hypercholesterolemia  ?  On crestor.  Low cholesterol diet and exercise.  Follow lipid panel and liver function tests.   ?  ?  ? Relevant Orders  ? Basic metabolic panel  ? Lipid panel  ? Hepatic function panel  ? Neck pain  ?  Neck and shoulder pain as outlined.  Increased pain with rotation of head - limited rom.  Taking antiinflammatory medication.  Add muscle relaxer. Discussed possible side effects.  Check c-spine xray.   ?  ?  ? Relevant Orders  ? DG Cervical Spine 2 or 3 views (Completed)  ? Stress  ?  Increased stress.  Discussed.  Off buspar.  Desires no further intervention.  Follow.  ?  ?  ? Thrombocytopenia ([place])  ?  Follow cbc.  ?  ?  ? Relevant Orders  ? CBC with Differential/Platelet  ? ?Other Visit Diagnoses   ? ? Encounter for hepatitis C screening test for low risk patient      ? Relevant Orders  ? Hepatitis C antibody  ? Screening for HIV without presence of risk factors      ? Relevant Orders  ? HIV Antibody (routine testing w rflx)  ? ?  ? ? ? ?Einar Pheasant, MD  ?

## 2022-02-14 ENCOUNTER — Encounter: Payer: Self-pay | Admitting: Internal Medicine

## 2022-02-15 ENCOUNTER — Encounter: Payer: Self-pay | Admitting: Internal Medicine

## 2022-02-15 MED ORDER — TIZANIDINE HCL 4 MG PO TABS: 4.0000 mg | ORAL_TABLET | Freq: Every day | ORAL | 0 refills | Status: DC | PRN

## 2022-02-15 NOTE — Assessment & Plan Note (Signed)
Follow cbc.  

## 2022-02-15 NOTE — Assessment & Plan Note (Signed)
Had reported increased fatigue and daytime somnolence.  Have discussed possible sleep apnea.  Had agreed on referral to pulmonary for evaluation of question of sleep apnea and need for sleep study.   ?

## 2022-02-15 NOTE — Assessment & Plan Note (Signed)
On crestor.  Low cholesterol diet and exercise.  Follow lipid panel and liver function tests.   

## 2022-02-15 NOTE — Assessment & Plan Note (Signed)
Notify me when agreeable for colonoscopy.   

## 2022-02-15 NOTE — Assessment & Plan Note (Signed)
Increased stress.  Discussed.  Off buspar.  Desires no further intervention.  Follow.  

## 2022-02-15 NOTE — Assessment & Plan Note (Signed)
On mounjaro.  Low carb diet and exercise.   Follow met b and a1c.  ?

## 2022-02-15 NOTE — Assessment & Plan Note (Signed)
Neck and shoulder pain as outlined.  Increased pain with rotation of head - limited rom.  Taking antiinflammatory medication.  Add muscle relaxer. Discussed possible side effects.  Check c-spine xray.   ?

## 2022-02-16 ENCOUNTER — Telehealth: Payer: Self-pay

## 2022-02-16 ENCOUNTER — Encounter: Payer: Self-pay | Admitting: Internal Medicine

## 2022-02-16 NOTE — Telephone Encounter (Signed)
Called patient. Mail box full.

## 2022-02-16 NOTE — Telephone Encounter (Signed)
-----   Message from Dale , MD sent at 02/16/2022  4:59 AM EDT ----- ?Notify - xray reveals some straightening of the normal curve - ( related to muscle tension). Also, trace anterolisthesis of C2 on C3 - meaning C2 pushing down on C3.  I did send in muscle relaxer.  Needs to take.  Call with update.  Given persistent pain and symptoms, can refer to PT for further evaluation and treatment.   ?

## 2022-02-19 NOTE — Telephone Encounter (Signed)
Patient returned phone call. Note was read, patient was wondering if a MRI should be done first before PT. ?

## 2022-02-19 NOTE — Telephone Encounter (Signed)
Called patient. Mail box full.

## 2022-02-19 NOTE — Telephone Encounter (Signed)
Patient aware of results and agreeable to see PT. ?

## 2022-02-24 ENCOUNTER — Telehealth: Payer: Self-pay

## 2022-02-24 NOTE — Chronic Care Management (AMB) (Signed)
?  Chronic Care Management  ? ?Note ? ?02/24/2022 ?Name: Edward Spencer MRN: [id number] DOB: 1971-01-21 ? ?Edward Spencer is a 51 y.o. year old male who is a primary care patient of Dale Brevig [place], MD. Edward Spencer is currently enrolled in care management services. An additional referral for RNCM was placed.  ? ?Follow up plan: ?Unsuccessful telephone outreach attempt made.  ?The care management team will reach out to the patient again over the next 7 days.  ?If patient returns call to provider office, please advise to call Embedded Care Management Care Guide Penne Lash  at [phone] ? ?Penne Lash, RMA ?Care Guide, Embedded Care Coordination ?[facility]  ?[place], [place] [address] ?Direct Dial: [phone] ?[email or web address] ?Website: [email or web address]  ? ?

## 2022-03-16 ENCOUNTER — Other Ambulatory Visit: Payer: Self-pay | Admitting: Internal Medicine

## 2022-03-27 ENCOUNTER — Telehealth: Payer: BC Managed Care – PPO

## 2022-03-27 ENCOUNTER — Other Ambulatory Visit (INDEPENDENT_AMBULATORY_CARE_PROVIDER_SITE_OTHER): Payer: BC Managed Care – PPO

## 2022-03-27 DIAGNOSIS — E7800 Pure hypercholesterolemia, unspecified: Secondary | ICD-10-CM | POA: Diagnosis not present

## 2022-03-27 DIAGNOSIS — D696 Thrombocytopenia, unspecified: Secondary | ICD-10-CM | POA: Diagnosis not present

## 2022-03-27 DIAGNOSIS — Z114 Encounter for screening for human immunodeficiency virus [HIV]: Secondary | ICD-10-CM | POA: Diagnosis not present

## 2022-03-27 DIAGNOSIS — E1165 Type 2 diabetes mellitus with hyperglycemia: Secondary | ICD-10-CM

## 2022-03-27 DIAGNOSIS — Z1159 Encounter for screening for other viral diseases: Secondary | ICD-10-CM | POA: Diagnosis not present

## 2022-03-27 LAB — LIPID PANEL
Cholesterol: 126 mg/dL (ref 0–200)
HDL: 31.3 mg/dL — ABNORMAL LOW (ref >39.00)
NonHDL: 95.01
Total CHOL/HDL Ratio: 4
Triglycerides: 375 mg/dL — ABNORMAL HIGH (ref 0.0–149.0)
VLDL: 75 mg/dL — ABNORMAL HIGH (ref 0.0–40.0)

## 2022-03-27 LAB — HEPATIC FUNCTION PANEL
ALT: 20 U/L (ref 0–53)
AST: 13 U/L (ref 0–37)
Albumin: 4.5 g/dL (ref 3.5–5.2)
Alkaline Phosphatase: 89 U/L (ref 39–117)
Bilirubin, Direct: 0.1 mg/dL (ref 0.0–0.3)
Total Bilirubin: 0.6 mg/dL (ref 0.2–1.2)
Total Protein: 6.8 g/dL (ref 6.0–8.3)

## 2022-03-27 LAB — CBC WITH DIFFERENTIAL/PLATELET
Basophils Absolute: 0 10*3/uL (ref 0.0–0.1)
Basophils Relative: 0.4 % (ref 0.0–3.0)
Eosinophils Absolute: 0.2 10*3/uL (ref 0.0–0.7)
Eosinophils Relative: 3.3 % (ref 0.0–5.0)
HCT: 42.4 % (ref 39.0–52.0)
Hemoglobin: 14.8 g/dL (ref 13.0–17.0)
Lymphocytes Relative: 26.2 % (ref 12.0–46.0)
Lymphs Abs: 1.7 10*3/uL (ref 0.7–4.0)
MCHC: 34.9 g/dL (ref 30.0–36.0)
MCV: 89 fl (ref 78.0–100.0)
Monocytes Absolute: 0.4 10*3/uL (ref 0.1–1.0)
Monocytes Relative: 6.3 % (ref 3.0–12.0)
Neutro Abs: 4.1 10*3/uL (ref 1.4–7.7)
Neutrophils Relative %: 63.8 % (ref 43.0–77.0)
Platelets: 150 10*3/uL (ref 150.0–400.0)
RBC: 4.76 Mil/uL (ref 4.22–5.81)
RDW: 12.7 % (ref 11.5–15.5)
WBC: 6.4 10*3/uL (ref 4.0–10.5)

## 2022-03-27 LAB — BASIC METABOLIC PANEL
BUN: 19 mg/dL (ref 6–23)
CO2: 27 mEq/L (ref 19–32)
Calcium: 9.5 mg/dL (ref 8.4–10.5)
Chloride: 104 mEq/L (ref 96–112)
Creatinine, Ser: 1.01 mg/dL (ref 0.40–1.50)
GFR: 85.7 mL/min (ref >60.00)
Glucose, Bld: 261 mg/dL — ABNORMAL HIGH (ref 70–99)
Potassium: 4.5 mEq/L (ref 3.5–5.1)
Sodium: 139 mEq/L (ref 135–145)

## 2022-03-27 LAB — HEMOGLOBIN A1C: Hgb A1c MFr Bld: 10.8 % — ABNORMAL HIGH (ref 4.6–6.5)

## 2022-03-27 LAB — LDL CHOLESTEROL, DIRECT: Direct LDL: 58 mg/dL

## 2022-03-28 ENCOUNTER — Other Ambulatory Visit: Payer: Self-pay | Admitting: Internal Medicine

## 2022-03-28 DIAGNOSIS — E7800 Pure hypercholesterolemia, unspecified: Secondary | ICD-10-CM

## 2022-03-30 LAB — HEPATITIS C ANTIBODY
Hepatitis C Ab: NONREACTIVE
SIGNAL TO CUT-OFF: 0.09 (ref <1.00)

## 2022-03-30 LAB — HIV ANTIBODY (ROUTINE TESTING W REFLEX): HIV 1&2 Ab, 4th Generation: NONREACTIVE

## 2022-04-23 ENCOUNTER — Ambulatory Visit (INDEPENDENT_AMBULATORY_CARE_PROVIDER_SITE_OTHER): Payer: BC Managed Care – PPO

## 2022-04-23 ENCOUNTER — Encounter: Payer: Self-pay | Admitting: Internal Medicine

## 2022-04-23 ENCOUNTER — Ambulatory Visit (INDEPENDENT_AMBULATORY_CARE_PROVIDER_SITE_OTHER): Payer: BC Managed Care – PPO | Admitting: Internal Medicine

## 2022-04-23 VITALS — BP 138/70 | HR 76 | Temp 98.5°F | Resp 15 | Ht 72.0 in | Wt 246.2 lb

## 2022-04-23 DIAGNOSIS — M545 Low back pain, unspecified: Secondary | ICD-10-CM | POA: Diagnosis not present

## 2022-04-23 DIAGNOSIS — E119 Type 2 diabetes mellitus without complications: Secondary | ICD-10-CM | POA: Diagnosis not present

## 2022-04-23 DIAGNOSIS — M25561 Pain in right knee: Secondary | ICD-10-CM | POA: Diagnosis not present

## 2022-04-23 DIAGNOSIS — E7800 Pure hypercholesterolemia, unspecified: Secondary | ICD-10-CM

## 2022-04-23 DIAGNOSIS — M542 Cervicalgia: Secondary | ICD-10-CM | POA: Diagnosis not present

## 2022-04-23 DIAGNOSIS — M1711 Unilateral primary osteoarthritis, right knee: Secondary | ICD-10-CM | POA: Diagnosis not present

## 2022-04-23 DIAGNOSIS — D696 Thrombocytopenia, unspecified: Secondary | ICD-10-CM

## 2022-04-23 LAB — MICROALBUMIN / CREATININE URINE RATIO
Creatinine,U: 104.8 mg/dL
Microalb Creat Ratio: 0.8 mg/g (ref 0.0–30.0)
Microalb, Ur: 0.9 mg/dL (ref 0.0–1.9)

## 2022-04-23 LAB — HM DIABETES FOOT EXAM

## 2022-04-23 MED ORDER — EMPAGLIFLOZIN 10 MG PO TABS: 10.0000 mg | ORAL_TABLET | Freq: Every day | ORAL | 1 refills | Status: DC

## 2022-04-23 NOTE — Progress Notes (Unsigned)
Patient ID: Edward Spencer, male   DOB: [birth year], 51 y.o.   MRN: [id number]   Subjective:    Patient ID: Edward Spencer, male    DOB: May 04, 1971, 51 y.o.   MRN: [id number]   Patient here for work in appt.  Chief Complaint  Patient presents with   Follow-up   Diabetes   .   HPI Recent a1c 10.7.  Discussed elevated a1c.  Discussed low carb diet and exercise.  He is currently on mounjaro.  Does not feel is helping and feels wiped out 3-4 days after injection.  Had intolerance to metformin.  Cost was an issue with jardiance.  Given elevation, discussed starting tresiba - basal insulin.  He is agreeable.  No chest pain.  Breathing stable.  Bowels stable.     Past Medical History:  Diagnosis Date   Allergy    Arthritis    Asthma    Depression    Diabetes mellitus without complication (HCC)    History of chicken pox    History reviewed. No pertinent surgical history. Family History  Problem Relation Age of Onset   Arthritis Mother    Hyperlipidemia Mother    Arthritis Father    Hypertension Father    Diabetes Father    Mental illness Maternal Aunt    Hyperlipidemia Maternal Aunt    Alcohol abuse Maternal Aunt    Mental illness Paternal Aunt    Alcohol abuse Paternal Aunt    Arthritis Paternal Uncle    Arthritis Maternal Grandmother    Arthritis Paternal Grandmother    Hyperlipidemia Paternal Grandmother    Diabetes Paternal Grandmother    Social History   Socioeconomic History   Marital status: Single    Spouse name: Not on file   Number of children: Not on file   Years of education: Not on file   Highest education level: Not on file  Occupational History   Not on file  Tobacco Use   Smoking status: Every Day    Packs/day: 0.25    Years: 40.00    Total pack years: 10.00    Types: Cigarettes   Smokeless tobacco: Never  Vaping Use   Vaping Use: Never used  Substance and Sexual Activity   Alcohol use: Yes    Alcohol/week: 0.0 standard drinks of alcohol     Comment: social   Drug use: No   Sexual activity: Not on file  Other Topics Concern   Not on file  Social History Narrative   Not on file   Social Determinants of Health   Financial Resource Strain: Low Risk  (09/26/2021)   Overall Financial Resource Strain (CARDIA)    Difficulty of Paying Living Expenses: Not hard at all  Food Insecurity: Not on file  Transportation Needs: Not on file  Physical Activity: Not on file  Stress: Not on file  Social Connections: Not on file     Review of Systems  Constitutional:  Negative for appetite change and unexpected weight change.  HENT:  Negative for congestion and sinus pressure.   Respiratory:  Negative for cough, chest tightness and shortness of breath.   Cardiovascular:  Negative for chest pain, palpitations and leg swelling.  Gastrointestinal:  Negative for abdominal pain, diarrhea, nausea and vomiting.  Genitourinary:  Negative for difficulty urinating and dysuria.  Musculoskeletal:  Negative for joint swelling and myalgias.  Skin:  Negative for color change and rash.  Neurological:  Negative for dizziness, light-headedness and headaches.  Psychiatric/Behavioral:  Negative  for agitation and dysphoric mood.        Objective:     BP 138/70 (BP Location: Left Arm, Patient Position: Sitting, Cuff Size: Large)   Pulse 76   Temp 98.5 F (36.9 C) (Temporal)   Resp 15   Ht 6' (1.829 m)   Wt 246 lb 3.2 oz (111.7 kg)   SpO2 99%   BMI 33.39 kg/m  Wt Readings from Last 3 Encounters:  04/23/22 246 lb 3.2 oz (111.7 kg)  02/13/22 248 lb (112.5 kg)  10/13/21 253 lb (114.8 kg)    Physical Exam Constitutional:      General: He is not in acute distress.    Appearance: Normal appearance. He is well-developed.  HENT:     Head: Normocephalic and atraumatic.     Right Ear: External ear normal.     Left Ear: External ear normal.  Eyes:     General: No scleral icterus.       Right eye: No discharge.        Left eye: No discharge.   Cardiovascular:     Rate and Rhythm: Normal rate and regular rhythm.  Pulmonary:     Effort: Pulmonary effort is normal. No respiratory distress.     Breath sounds: Normal breath sounds.  Abdominal:     General: Bowel sounds are normal.     Palpations: Abdomen is soft.     Tenderness: There is no abdominal tenderness.  Musculoskeletal:        General: No swelling or tenderness.     Cervical back: Neck supple. No tenderness.  Lymphadenopathy:     Cervical: No cervical adenopathy.  Skin:    Findings: No erythema or rash.  Neurological:     Mental Status: He is alert.  Psychiatric:        Mood and Affect: Mood normal.        Behavior: Behavior normal.      Outpatient Encounter Medications as of 04/23/2022  Medication Sig   empagliflozin (JARDIANCE) 10 MG TABS tablet Take 1 tablet (10 mg total) by mouth daily before breakfast.   insulin degludec (TRESIBA FLEXTOUCH) 100 UNIT/ML FlexTouch Pen Inject 10 Units into the skin daily.   loratadine (CLARITIN) 10 MG tablet Take 10 mg by mouth daily.   rosuvastatin (CRESTOR) 40 MG tablet TAKE 1 TABLET(40 MG) BY MOUTH DAILY   tiZANidine (ZANAFLEX) 4 MG tablet TAKE 1 TABLET(4 MG) BY MOUTH DAILY IN THE EVENING AS NEEDED FOR MUSCLE SPASMS. DO NOT TAKE AND GO DRIVING OR OPERATE MACHINERY   vitamin B-12 (CYANOCOBALAMIN) 100 MCG tablet Take 100 mcg by mouth daily.   [DISCONTINUED] tirzepatide [facility](MOUNJARO) 2.5 MG/0.5ML Pen Inject 2.5 mg into the skin once a week.   No facility-administered encounter medications on file as of 04/23/2022.     Lab Results  Component Value Date   WBC 6.4 03/27/2022   HGB 14.8 03/27/2022   HCT 42.4 03/27/2022   PLT 150.0 03/27/2022   GLUCOSE 261 (H) 03/27/2022   CHOL 126 03/27/2022   TRIG 375.0 (H) 03/27/2022   HDL 31.30 (L) 03/27/2022   LDLDIRECT 58.0 03/27/2022   LDLCALC 51 12/08/2021   ALT 20 03/27/2022   AST 13 03/27/2022   NA 139 03/27/2022   K 4.5 03/27/2022   CL 104 03/27/2022   CREATININE 1.01  03/27/2022   BUN 19 03/27/2022   CO2 27 03/27/2022   TSH 1.35 12/08/2021   PSA 0.34 12/08/2021   HGBA1C 10.8 Repeated and verified X2. (  H) 03/27/2022   MICROALBUR 0.9 04/23/2022       Assessment & Plan:   Problem List Items Addressed This Visit     Back pain   Relevant Orders   Ambulatory referral to Physical Therapy   Diabetes mellitus (HCC) - Primary    Will stop mounjaro due to intolerance and due to not feeling is helping his sugars.  Intolerance to metformin.  Discussed other treatment options.  jardiance - expense.  Will resent and see if can get a tier exemption given intolerance to metformin and mounjaro.  Start tresiba 10 units q day - given elevated a1c.  CMA instructed on how to give insulin.  Follow sugars and send in readings.        Relevant Medications   empagliflozin (JARDIANCE) 10 MG TABS tablet   insulin degludec (TRESIBA FLEXTOUCH) 100 UNIT/ML FlexTouch Pen   Other Relevant Orders   Urine microalbumin-creatinine with uACR (Completed)   Hypercholesterolemia    On crestor.  Low cholesterol diet and exercise.  Follow lipid panel and liver function tests.        Neck pain    Persistent.  Neck pain as outlined previously.  Xray reviewed and was referred to PT.       Relevant Orders   Ambulatory referral to Physical Therapy   Right knee pain    Persistent.  Check xray.  Follow.       Relevant Orders   DG Knee 1-2 Views Right (Completed)   Thrombocytopenia (HCC)    Follow cbc.         Dale Ridgway, MD

## 2022-04-25 ENCOUNTER — Other Ambulatory Visit: Payer: Self-pay | Admitting: Internal Medicine

## 2022-04-26 ENCOUNTER — Encounter: Payer: Self-pay | Admitting: Internal Medicine

## 2022-04-26 ENCOUNTER — Telehealth: Payer: Self-pay | Admitting: Internal Medicine

## 2022-04-26 MED ORDER — TRESIBA FLEXTOUCH 100 UNIT/ML ~~LOC~~ SOPN
10.0000 [IU] | PEN_INJECTOR | Freq: Every day | SUBCUTANEOUS | 3 refills | Status: DC
Start: 2022-04-26 — End: 2022-09-07

## 2022-04-26 NOTE — Assessment & Plan Note (Signed)
Will stop mounjaro due to intolerance and due to not feeling is helping his sugars.  Intolerance to metformin.  Discussed other treatment options.  jardiance - expense.  Will resent and see if can get a tier exemption given intolerance to metformin and mounjaro.  Start tresiba 10 units q day - given elevated a1c.  CMA instructed on how to give insulin.  Follow sugars and send in readings.

## 2022-04-26 NOTE — Assessment & Plan Note (Signed)
Persistent.  Neck pain as outlined previously.  Xray reviewed and was referred to PT.

## 2022-04-26 NOTE — Assessment & Plan Note (Signed)
On crestor.  Low cholesterol diet and exercise.  Follow lipid panel and liver function tests.   

## 2022-04-26 NOTE — Telephone Encounter (Signed)
Please f/u with jardiance - cost, etc.  Also, please confirm if can arrange for PT for neck and knee.  Thanks

## 2022-04-26 NOTE — Assessment & Plan Note (Signed)
Persistent.  Check xray.  Follow.

## 2022-04-26 NOTE — Assessment & Plan Note (Signed)
Follow cbc.  

## 2022-04-27 NOTE — Telephone Encounter (Signed)
I called the patient and he stated that they Edward Spencer is being worked on and he is scheduled for PT on his neck and knees for July.  Jamarious Febo,cma

## 2022-04-27 NOTE — Telephone Encounter (Signed)
I think Nolberto Hanlon was working on [employer].  I will send this back to her box to confirm.

## 2022-04-29 NOTE — Telephone Encounter (Signed)
Please notify Edward Spencer of the attached information and confirm his sugars are still doing better.

## 2022-04-29 NOTE — Telephone Encounter (Signed)
Jardiance approved by insurance, but copay is over $3,000. Sending [facility] referral for possible pt assistance.   LM to advise pt of current status of medication

## 2022-04-30 NOTE — Telephone Encounter (Signed)
S/w pt - advised of [facility] referral. Stated his lowest sugar reading today was 178 , highest was "just under 300" Pt getting nervous about readings.  Would we be able to give him samples of the Jardiance while [facility] referral is being worked on ?

## 2022-05-01 ENCOUNTER — Telehealth: Payer: Self-pay | Admitting: Pharmacist

## 2022-05-01 ENCOUNTER — Encounter: Payer: BC Managed Care – PPO | Admitting: Primary Care

## 2022-05-01 NOTE — Telephone Encounter (Signed)
Ok to give samples of jardiance.  Will start with 10mg  q day.

## 2022-05-01 NOTE — Progress Notes (Signed)
[facility]) Care Management  [facility]   05/01/2022  Edward Spencer 04-13-1971 [id number]   Reason for referral: Medication Assistance  Referral source: Provider-Dr. Dale Galax Current insurance:[employer] Blue Shield Commercial   HPI: Patient is a 51 year old man with diabetes, stress, and hypercholesterolemia.  Patient expressed concern with the cost of Jardiance.  He reported the cost being >$400 for a 30 day supply.  Patient said his insurance does not pay for maintenance medications.   Objective: Lab Results  Component Value Date   CREATININE 1.01 03/27/2022   CREATININE 1.07 12/08/2021   CREATININE 0.99 06/23/2021    Lab Results  Component Value Date   HGBA1C 10.8 Repeated and verified X2. (H) 03/27/2022    Lipid Panel     Component Value Date/Time   CHOL 126 03/27/2022 0801   TRIG 375.0 (H) 03/27/2022 0801   HDL 31.30 (L) 03/27/2022 0801   CHOLHDL 4 03/27/2022 0801   VLDL 75.0 (H) 03/27/2022 0801   LDLCALC 51 12/08/2021 0759   LDLCALC 151 (H) 12/30/2018 0846   LDLDIRECT 58.0 03/27/2022 0801    BP Readings from Last 3 Encounters:  04/23/22 138/70  02/13/22 128/70  10/13/21 132/76    No Known Allergies  Medications Reviewed Today     Reviewed by Dale Van, MD (Physician) on 04/26/22 at 1807  Med List Status: <None>   Medication Order Taking? Sig Documenting Provider Last Dose Status Informant  empagliflozin (JARDIANCE) 10 MG TABS tablet [id number] Yes Take 1 tablet (10 mg total) by mouth daily before breakfast. Dale Minneiska, MD  Active   loratadine (CLARITIN) 10 MG tablet [id number] No Take 10 mg by mouth daily. [provider] Taking Active Self  rosuvastatin (CRESTOR) 40 MG tablet [id number]  TAKE 1 TABLET(40 MG) BY MOUTH DAILY Dale Sheridan, MD  Active   tirzepatide [facility]) 2.5 MG/0.5ML Pen [id number] No Inject 2.5 mg into the skin once a week. Dale [place], MD Taking Active   tiZANidine (ZANAFLEX)  4 MG tablet [id number]  TAKE 1 TABLET(4 MG) BY MOUTH DAILY IN THE EVENING AS NEEDED FOR MUSCLE SPASMS. DO NOT TAKE AND GO DRIVING OR OPERATE MACHINERY Dale [place], MD  Active   vitamin B-12 (CYANOCOBALAMIN) 100 MCG tablet [id number] No Take 100 mcg by mouth daily. 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  Patient's with commercial insurance are not eligible for patient assistance programs.  However, they are eligible for manufacturer coupon programs.  Lilly has a coupon for [employer] but it has a maximum savings of $175 per 30 day supply script.  Patient said he did not want to proceed with the coupon but wanted to speak with Dr. Lorin Picket first. (The coupon would subtract$175 from the >$400 of his insurance copayment.)   Plan:  Route note to Dr. Lorin Picket.    Beecher Mcardle, PharmD, BCACP [facility] Clinical Pharmacist [phone]

## 2022-05-01 NOTE — Telephone Encounter (Signed)
Pt notified - 2 boxes of samples for pt ready for pick up

## 2022-05-01 NOTE — Progress Notes (Signed)
 This encounter was created in error - please disregard.

## 2022-05-01 NOTE — Patient Instructions (Signed)
  Sleep apnea is defined as period of 10 seconds or longer when you stop breathing at night. This can happen multiple times a night. Dx sleep apnea is when this occurs more than 5 times an hour.    Mild OSA 5-15 apneic events an hour Moderate OSA 15-30 apneic events an hour Severe OSA > 30 apneic events an hour   Untreated sleep apnea puts you at higher risk for cardiac arrhythmias, pulmonary HTN, stroke and diabetes   Treatment options include weight loss, side sleeping position, oral appliance, CPAP therapy or referral to ENT for possible surgical options    Recommendations: - Focus on side sleeping position or elevate head of bed 30 degrees while sleeping - Avoid sedating medication or excessive alcohol use prior to bedtime as these can worsen underlying sleep apnea - Do not drive if experiencing excessive daytime sleepiness of fatigue    Orders: Home sleep study re: loud snoring   Referral: ENT re: chronic rhinitis    Follow-up: Please schedule follow-up 1-2 weeks after completing home sleep study to review results and treatment if needed

## 2022-05-05 NOTE — Progress Notes (Signed)
Please try calling Silva Bandy and see if she can Help him out.  Thanks

## 2022-05-06 ENCOUNTER — Other Ambulatory Visit: Payer: Self-pay

## 2022-05-06 MED ORDER — EMPAGLIFLOZIN 10 MG PO TABS
10.0000 mg | ORAL_TABLET | Freq: Every day | ORAL | 1 refills | Status: DC
  Filled 2022-05-06: qty 30, 30d supply, fill #0

## 2022-05-07 ENCOUNTER — Telehealth: Payer: Self-pay

## 2022-05-07 NOTE — Telephone Encounter (Signed)
S/w Jaridance rep Noberto Retort- given savings card that per Bonita Quin, with [employer], is supposed to get him medication for 10$.    Savings card left up front attached to samples pt has not picked up yet.  Pt had supply of Jardiance at home, is finished with that come tomorrow.  Pt girlfriend Edward Spencer will pick up Jardiance samples and coupon today.

## 2022-05-11 ENCOUNTER — Encounter: Payer: Self-pay | Admitting: Internal Medicine

## 2022-05-12 ENCOUNTER — Other Ambulatory Visit: Payer: Self-pay

## 2022-05-12 MED ORDER — EMPAGLIFLOZIN 10 MG PO TABS: 10.0000 mg | ORAL_TABLET | Freq: Every day | ORAL | 1 refills | Status: DC

## 2022-05-22 ENCOUNTER — Ambulatory Visit: Payer: BC Managed Care – PPO | Attending: Internal Medicine

## 2022-05-22 DIAGNOSIS — M545 Low back pain, unspecified: Secondary | ICD-10-CM | POA: Diagnosis not present

## 2022-05-22 DIAGNOSIS — M542 Cervicalgia: Secondary | ICD-10-CM | POA: Insufficient documentation

## 2022-05-22 DIAGNOSIS — G8929 Other chronic pain: Secondary | ICD-10-CM | POA: Diagnosis not present

## 2022-05-22 DIAGNOSIS — M25561 Pain in right knee: Secondary | ICD-10-CM | POA: Diagnosis not present

## 2022-05-22 NOTE — Therapy (Signed)
OUTPATIENT PHYSICAL THERAPY NECK EVALUATION   Patient Name: Edward Spencer MRN: [id number] DOB:1971/01/04, 51 y.o., male Today's Date: 05/22/2022   PT End of Session - 05/22/22 1001     Visit Number 1    Number of Visits 17    Date for PT Re-Evaluation 07/17/22    Authorization Type eval: 05/22/22    PT Start Time 1015    PT Stop Time 1100    PT Time Calculation (min) 45 min    Activity Tolerance Patient tolerated treatment well    Behavior During Therapy WFL for tasks assessed/performed             Past Medical History:  Diagnosis Date   Allergy    Arthritis    Asthma    Depression    Diabetes mellitus without complication (HCC)    History of chicken pox    History reviewed. No pertinent surgical history. Patient Active Problem List   Diagnosis Date Noted   Right knee pain 04/23/2022   Neck pain 02/13/2022   Stress 07/17/2021   Back pain 06/12/2021   Vaccine counseling 02/02/2021   Thrombocytopenia (HCC) 01/29/2021   Fatigue 05/25/2020   Colon cancer screening 05/25/2020   Decreased hearing 12/09/2019   Sinusitis 05/03/2019   COVID-19 virus detected 04/23/2019   Hypercholesterolemia 02/05/2019   Headache 02/02/2016   Health care maintenance 09/29/2015   Lateral epicondylitis 08/19/2015   Right elbow pain 08/11/2015   Left shoulder pain 08/11/2015   Environmental allergies 08/11/2015   Diabetes mellitus (HCC) 08/09/2015    PCP: Dale Tennille, MD  REFERRING PROVIDER: Dale Woodloch, MD  REFERRING DIAGNOSIS: M54.2 (ICD-10-CM) - Neck pain, M54.50 (ICD-10-CM) - Midline low back pain without sciatica, unspecified chronicity  THERAPY DIAG: Cervicalgia  RATIONALE FOR EVALUATION AND TREATMENT: Rehabilitation  ONSET DATE: 12/17/21 (approximate)  FOLLOW UP APPT WITH PROVIDER: No, pt advised to call if needed   SUBJECTIVE:                                                                                                                                                                                          Chief Complaint: Neck pain  Pertinent History Pt reports lower cervical/upper thoracic pain as well as L upper trap pain, worsened with L cervical rotation. Symptoms started in February but pt doesn't remember the exact date. He woke up one morning and felt fine but once at work started having difficulty turning his head. Symptoms persisted for about a month and then gradually improved. He started taking muscle relaxers at night to help with the pain and allow him to sleep but he didn't get much benefit.  His symptoms are now intermittent and "come and go." They are most notable with L cervical rotation and pt has noticed that when he drives he now has to turn his shoulders to look to the left. He reports occasional numbness/tingling in the left upper trap area, but symptoms don't extend past his left shoulder. He has noticed progressive loss of bilateral grip strength over the last few years but nothing acute. No focal weakness in BUE.    Pain:  Pain Intensity: Present: 0/10, Best: 0/10, Worst: 5/10 Pain location: L side base of skull along L upper trapezius ridge and between shoulder blades  Pain Quality:  tight, tearing    Radiating: Yes, see history Numbness/Tingling: Yes, see history Focal Weakness: No Aggravating factors: "come and go." Pain is the worst with L cervical rotation especially with driving; Relieving factors: alternating ice/heat, Ibuprofen (takes every day), manual pressure, "feels like it needs to pop" 24-hour pain behavior: usually symptoms don't start until he has been awake for a few hours, possibly eases slightly in the evening time History of prior neck injury, pain, surgery, or therapy: No Falls: Has patient fallen in last 6 months? No Follow-up appointment with MD: No Dominant hand: right Imaging: Yes, 02/13/22 plain films: FINDINGS: The C7 vertebral body is only partially visualized. There is no evidence of cervical spine  fracture or prevertebral soft tissue swelling. Straightening of the normal cervical lordosis with trace anterolisthesis of C2 on C3. Intervertebral disc space height is maintained. No other significant bone abnormalities are identified. IMPRESSION: No fracture or traumatic malalignment of the cervical spine. 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  Recent memory is intact.  Remote memory is intact.  Attention span and concentration are intact.  Expressive speech is intact.  Patient's fund of knowledge is within normal limits for educational level.    Gross Musculoskeletal Assessment Tremor: None Bulk: Normal Tone: Normal  Gait Deferred  Posture Significant forward head and rounded shoulders in sitting, able to correct with cues  AROM  PROM (Normal range in degrees) AROM 05/22/2022  Cervical  Flexion (50) 28*  Extension (80) 42  Right lateral flexion (45) 35*  Left lateral flexion (45) 35*  Right rotation (85) 50*  Left rotation (85) 25*  (* = pain; Blank rows = not tested)  MMT  MMT (out of 5) Right 05/22/2022 Left 05/22/2022  Cervical (isometric)  Flexion WNL*  Extension WNL  Lateral Flexion WNL* WNL*  Rotation WNL* WNL      Shoulder   Flexion 5 4+  Extension    Abduction 4+ 5  Internal rotation    Horizontal abduction    Horizontal adduction    Lower Trapezius    Rhomboids        Elbow  Flexion 5 5  Extension 5 5  Pronation    Supination        Wrist  Flexion 5 5   Extension 5 5  Radial deviation    Ulnar deviation        MCP  Flexion    Extension  Abduction    Adduction    (* = pain; Blank rows = not tested)  Sensation Grossly intact to light touch bilateral UE as determined by testing dermatomes C2-T2. Proprioception and hot/cold testing deferred on this date.  Reflexes Deferred  Palpation  Location LEFT  RIGHT           Suboccipitals 1 1  Cervical paraspinals 1 1  Upper Trapezius 1 0  Levator Scapulae 1 0  Rhomboid Major/Minor 1 0  (Blank rows = not tested) Graded on 0-4 scale (0 = no pain, 1 = pain, 2 = pain with wincing/grimacing/flinching, 3 = pain with withdrawal, 4 = unwilling to allow palpation), (Blank rows = not tested)   Repeated Movements No centralization or peripheralization of symptoms with repeated cervical retraction.    Passive Accessory Intervertebral Motion Pt reports tenderness centrally (no radiation laterally) with CPA and bilateral UPA to C2-T1 and UPA bilaterally C2-T1. Generally, hypomobile throughout cervical spine. Pt is particularly hypomobile in thoracic spine however no reproduction fo symptoms    SPECIAL TESTS Spurlings A (ipsilateral lateral flexion/axial compression): R: Positive for pain at base of skull L: Positive for pain at base of skull Spurlings B (ipsilateral lateral flexion/contralateral rotation/axial compression): R: Negative L: Positive for pain at base of skull Distraction Test: Positive for relief  Hoffman Sign (cervical cord compression): R: Negative L: Negative ULTT Median: R: Not examined L: Not examined ULTT Ulnar: R: Not examined L: Not examined ULTT Radial: R: Not examined L: Not examined   TODAY'S TREATMENT    Manual Therapy  Supine PA thrust manipulation (grade V) to upper thoracic spine with cavitation. Pt reports significant pain relief following manipulation   Trigger Point Dry Needling (TDN), unbilled Education performed with patient regarding potential  benefit of TDN. Reviewed precautions and risks with patient. Reviewed special precautions/risks over lung fields which include pneumothorax. Reviewed signs and symptoms of pneumothorax and advised pt to go to ER immediately if these symptoms develop advise them of dry needling treatment. Pt provided verbal consent to treatment. Using clean technique TDN performed to left upper trap with 2, 0.25 x 40 single needle placements with vigorous local twitch response (LTR) during both placements. Pistoning technique utilized. Significant improvement in pain-free L cervical rotation after manipulation and TDN. L rotation increased from 25 degrees with pain to 65 degrees without pain.   PATIENT EDUCATION:  Education details: Plan of care Person educated: Patient Education method: Explanation Education comprehension: verbalized understanding   HOME EXERCISE PROGRAM: Access Code: [email or web address] URL: [email or web address] Date: 05/22/2022 Prepared by: Ria Comment  Exercises - Seated Gentle Upper Trapezius Stretch (Mirrored)  - 2 x daily - 7 x weekly - 3 reps - 30s hold - Seated Cervical Sidebending Stretch (Mirrored)  - 2 x daily - 7 x weekly - 3 reps - 30s hold - Seated Correct Posture  - 7 x weekly - Standing Shoulder Row with Anchored Resistance  - 1 x daily - 7 x weekly - 2 sets - 10 reps - 3s hold   ASSESSMENT:  CLINICAL IMPRESSION: Patient is a 51 y.o. male who was seen today for physical therapy evaluation and treatment for neck pain. Objective impairments include decreased ROM, decreased strength, postural dysfunction, and pain. Pt with significant loss of cervical range of motion and pain in multiple directions but most notably L rotation. This improves from 25 degrees to with pain to 65 degrees without pain following dry needling to left upper trap and upper thoracic spinal manipulation. Patient's  impairments are limiting patient from driving and occupation. Personal factors  including Past/current experiences, Time since onset of injury/illness/exacerbation, and 1 comorbidity: DM  are also affecting patient's functional outcome. Patient will benefit from skilled PT to address above impairments and improve overall function.  REHAB POTENTIAL: Excellent  CLINICAL DECISION MAKING: Stable/uncomplicated  EVALUATION COMPLEXITY: Low   GOALS: Goals reviewed with patient? Yes  SHORT TERM GOALS: Target date: 06/19/2022  Pt will be independent with HEP to improve strength and decrease neck pain to improve pain-free function at home and work. Baseline:  Goal status: INITIAL   LONG TERM GOALS: Target date: 07/17/2022  Pt will increase FOTO to at least 66 to demonstrate significant improvement in function at home and work related to neck pain  Baseline: 05/22/22: 53 Goal status: INITIAL  2.  Pt will decrease worst neck pain by at least 2 points on the NPRS in order to demonstrate clinically significant reduction in neck pain. Baseline: 05/22/22: Worst: 5/10 Goal status: INITIAL  3.  Pt will decrease NDI score to less than 5% in order demonstrate clinically significant reduction in neck pain/disability.       Baseline: 05/22/22: 16% Goal status: INITIAL  4.  Pt will demonstrate improvement in bilateral cervical rotation to at least 60 degrees without pain in order to turn his head while driving and riding his motorcycle without pain Baseline: 05/22/22: R: 50 degrees, L: 25 degrees, both painful Goal status: INITIAL   PLAN: PT FREQUENCY: 1-2x/week  PT DURATION: 8 weeks  PLANNED INTERVENTIONS: Therapeutic exercises, Therapeutic activity, Neuromuscular re-education, Balance training, Gait training, Patient/Family education, Joint manipulation, Joint mobilization, Vestibular training, Canalith repositioning, Dry Needling, Electrical stimulation, Spinal manipulation, Spinal mobilization, Cryotherapy, Moist heat, Taping, Traction, Ultrasound, Ionotophoresis 4mg /ml  Dexamethasone, and Manual therapy  PLAN FOR NEXT SESSION: Review response to TDN, periscapular strength testing,  initiate cervical/thoracic mobilizations, assess pec length, progress neck and upper quarter strengthening, review and modify HEP as needed.  Tekia Waterbury PT, DPT, GCS  Kadarious Dikes 05/22/2022, 12:15 PM

## 2022-05-28 NOTE — Therapy (Addendum)
OUTPATIENT PHYSICAL THERAPY NECK TREATMENT/DISCHARGE   Patient Name: Edward Spencer MRN: [id number] DOB:1970-12-05, 51 y.o., male Today's Date: 06/08/2022      Past Medical History:  Diagnosis Date   Allergy    Arthritis    Asthma    Depression    Diabetes mellitus without complication ([place])    History of chicken pox    History reviewed. No pertinent surgical history. Patient Active Problem List   Diagnosis Date Noted   Right knee pain 04/23/2022   Neck pain 02/13/2022   Stress 07/17/2021   Back pain 06/12/2021   Vaccine counseling 02/02/2021   Thrombocytopenia ([place]) 01/29/2021   Fatigue 05/25/2020   Colon cancer screening 05/25/2020   Decreased hearing 12/09/2019   Acute sinusitis 05/03/2019   COVID-19 virus detected 04/23/2019   Hypercholesterolemia 02/05/2019   Headache 02/02/2016   Health care maintenance 09/29/2015   Lateral epicondylitis 08/19/2015   Right elbow pain 08/11/2015   Left shoulder pain 08/11/2015   Environmental allergies 08/11/2015   Diabetes mellitus ([place]) 08/09/2015    PCP: Einar Pheasant, MD  REFERRING PROVIDER: Einar Pheasant, MD  REFERRING DIAGNOSIS: M54.2 (ICD-10-CM) - Neck pain, M54.50 (ICD-10-CM) - Midline low back pain without sciatica, unspecified chronicity  THERAPY DIAG: Cervicalgia  RATIONALE FOR EVALUATION AND TREATMENT: Rehabilitation  ONSET DATE: 12/17/21 (approximate)  FOLLOW UP APPT WITH PROVIDER: No, pt advised to call if needed   FROM INITIAL EVALUATION  SUBJECTIVE:                                                                                                                                                                                         Chief Complaint: Neck pain  Pertinent History Pt reports lower cervical/upper thoracic pain as well as L upper trap pain, worsened with L cervical rotation. Symptoms started in February but pt doesn't remember the exact date. He woke up one morning and felt fine but  once at work started having difficulty turning his head. Symptoms persisted for about a month and then gradually improved. He started taking muscle relaxers at night to help with the pain and allow him to sleep but he didn't get much benefit. His symptoms are now intermittent and "come and go." They are most notable with L cervical rotation and pt has noticed that when he drives he now has to turn his shoulders to look to the left. He reports occasional numbness/tingling in the left upper trap area, but symptoms don't extend past his left shoulder. He has noticed progressive loss of bilateral grip strength over the last few years but nothing acute. No focal weakness in BUE.    Pain:  Pain Intensity: Present: 0/10, Best: 0/10, Worst: 5/10 Pain location: L side base of skull along L upper trapezius ridge and between shoulder blades  Pain Quality:  tight, tearing    Radiating: Yes, see history Numbness/Tingling: Yes, see history Focal Weakness: No Aggravating factors: "come and go." Pain is the worst with L cervical rotation especially with driving; Relieving factors: alternating ice/heat, Ibuprofen (takes every day), manual pressure, "feels like it needs to pop" 24-hour pain behavior: usually symptoms don't start until he has been awake for a few hours, possibly eases slightly in the evening time History of prior neck injury, pain, surgery, or therapy: No Falls: Has patient fallen in last 6 months? No Follow-up appointment with MD: No Dominant hand: right Imaging: Yes, 02/13/22 plain films: FINDINGS: The C7 vertebral body is only partially visualized. There is no evidence of cervical spine fracture or prevertebral soft tissue swelling. Straightening of the normal cervical lordosis with trace anterolisthesis of C2 on C3. Intervertebral disc space height is maintained. No other significant bone abnormalities are identified. IMPRESSION: No fracture or traumatic malalignment of the cervical spine. Prior  level of function: Independent Occupational demands: [employer]. 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  Recent memory is intact.  Remote memory is intact.  Attention span and concentration are intact.  Expressive speech is intact.  Patient's fund of knowledge is within normal limits for educational level.    Gross Musculoskeletal Assessment Tremor: None Bulk: Normal Tone: Normal  Gait Deferred  Posture Significant forward head and rounded shoulders in sitting, able to correct with cues  AROM  AROM (Normal range in degrees) AROM 06/08/2022  Cervical  Flexion (50) 28*  Extension (80) 42  Right lateral flexion (45) 35*  Left lateral flexion (45) 35*  Right rotation (85) 50*  Left rotation (85) 25*  (* = pain; Blank rows = not tested)  MMT  MMT (out of 5) Right 05/29/2022 Left 05/29/2022  Cervical (isometric)  Flexion WNL*  Extension WNL  Lateral Flexion WNL* WNL*  Rotation WNL* WNL      Shoulder   Flexion 5 4+  Extension 5 5  Abduction 4+ 5  Internal rotation 5 5  External rotation 5 5  Horizontal abduction 5 4+  Horizontal adduction    Lower Trapezius 4 3+*  Rhomboids 5 4+*      Elbow  Flexion 5 5  Extension 5 5  Pronation    Supination        Wrist  Flexion 5 5  Extension 5 5  Radial deviation    Ulnar deviation        MCP  Flexion    Extension    Abduction    Adduction    (* = pain; Blank rows = not tested)  Sensation Grossly intact to light touch bilateral UE as determined by testing dermatomes C2-T2. Proprioception and  hot/cold testing deferred on this date.  Reflexes Deferred  Palpation  Location LEFT  RIGHT           Suboccipitals 1 1  Cervical paraspinals 1 1  Upper Trapezius 1 0  Levator  Scapulae 1 0  Rhomboid Major/Minor 1 0  (Blank rows = not tested) Graded on 0-4 scale (0 = no pain, 1 = pain, 2 = pain with wincing/grimacing/flinching, 3 = pain with withdrawal, 4 = unwilling to allow palpation), (Blank rows = not tested)   Repeated Movements No centralization or peripheralization of symptoms with repeated cervical retraction.    Passive Accessory Intervertebral Motion Pt reports tenderness centrally (no radiation laterally) with CPA and bilateral UPA to C2-T1 and UPA bilaterally C2-T1. Generally, hypomobile throughout cervical spine. Pt is particularly hypomobile in thoracic spine however no reproduction fo symptoms    SPECIAL TESTS Spurlings A (ipsilateral lateral flexion/axial compression): R: Positive for pain at base of skull L: Positive for pain at base of skull Spurlings B (ipsilateral lateral flexion/contralateral rotation/axial compression): R: Negative L: Positive for pain at base of skull Distraction Test: Positive for relief  Hoffman Sign (cervical cord compression): R: Negative L: Negative ULTT Median: R: Not examined L: Not examined ULTT Ulnar: R: Not examined L: Not examined ULTT Radial: R: Not examined L: Not examined   TODAY'S TREATMENT   SUBJECTIVE: Pt reports that he is doing well today. He notes at least 80% improvement in symptoms since the initial evaluation and is now able to freely rotate his neck to the left without considerable pain. No specific questions or concerns currently.   Ther-ex   AROM (Normal range in degrees) AROM 05/29/2022  Cervical  Flexion (50) 40*  Extension (80) 38  Right lateral flexion (45) 40  Left lateral flexion (45) 40*  Right rotation (85) 72  Left rotation (85) 68  (* = pain; Blank rows = not tested)   MMT  MMT (out of 5)  Right 05/29/2022 Left 05/29/2022  Cervical (isometric)  Flexion WNL*  Extension WNL  Lateral Flexion WNL* WNL*  Rotation WNL* WNL      Shoulder   Flexion 5 4+  Extension 5 5  Abduction 4+ 5  Internal rotation 5 5  External rotation 5 5  Horizontal abduction 5 4+  Horizontal adduction    Lower Trapezius 4 3+*  Rhomboids 5 4+*      Elbow  Flexion 5 5  Extension 5 5  Pronation    Supination        Wrist  Flexion 5 5  Extension 5 5  Radial deviation    Ulnar deviation        MCP  Flexion    Extension    Abduction    Adduction    (* = pain; Blank rows = not tested)  Prone L horizontal abduction with straight elbow 4# dumbbell 2 x 10; Prone L low trap flexion with 4# dumbbell 2 x 10; Prone cervical retraction lifts 5s hold x 10; Supine repeated cervical retractions with overpressure by therapist x 10; Supine manually resisted cervical rotation and lateral flexion x 10 each bilateral;   Manual Therapy  Prone CPA, C2-T4, grade III, 30s/bout x 1 bout/level; Attempted prone thrust and supine upper thoracic manipulation but no cavitation today; STM with effleurage and trigger point release to left upper trap, levator scapulae, and cervical paraspinals. Also utilized Hypervolt for percussive STM; R lateral flexion, L upper trap, and L levator scapulae stretches x 30s each; Intermittent manual cervical traction 20s on/10s off x 3;   Trigger Point Dry Needling (TDN), unbilled Education previoulsy performed with patient regarding potential benefit of TDN. Previously reviewed precautions and risks with patient. Previously reviewed special precautions/risks over lung fields which  include pneumothorax. Previously reviewed signs and symptoms of pneumothorax and advised pt to go to ER immediately if these symptoms develop advise them of dry needling treatment. Pt provided verbal consent to treatment. Using clean technique with pt prone TDN performed to left upper trap with 2, 0.25 x  40 single needle placements with vigorous local twitch response (LTR) during both placements. Pistoning technique utilized.    PATIENT EDUCATION:  Education details: Pt educated throughout session about proper posture and technique with exercises. Improved exercise technique, movement at target joints, use of target muscles after min to mod verbal, visual, tactile cues.  Person educated: Patient Education method: Explanation Education comprehension: verbalized understanding   HOME EXERCISE PROGRAM: Access Code: [email or web address] URL: [email or web address] Date: 05/29/2022 Prepared by: Roxana Hires  Exercises - Seated Gentle Upper Trapezius Stretch (Mirrored)  - 2 x daily - 7 x weekly - 3 reps - 30s hold - Seated Cervical Sidebending Stretch (Mirrored)  - 2 x daily - 7 x weekly - 3 reps - 30s hold - Doorway Pec Stretch at 120 Degrees Abduction  - 2 x daily - 7 x weekly - 3 reps - 30s hold - Seated Correct Posture  - 7 x weekly - Standing Shoulder Row with Anchored Resistance  - 1 x daily - 7 x weekly - 2 sets - 10 reps - 3s hold   ASSESSMENT:  CLINICAL IMPRESSION: Patient demonstrates excellent motivation during session today. He reports at least 80% improvement in symptoms since the last therapy session. His cervical range of motion is considerably better with notably less pain compared to the initial evaluation. Additional strength testing performed today and pt does have some increased weakness in his L upper quarter compared to the right side. Initiated cervical and thoracic mobilizations during session today and introduced STM to L upper trap and levator scapulae. Left pec doorway stretch added to HEP and pt encouraged to follow-up as scheduled. Plan is to assess his knee pain at the next therapy session. Pt will benefit from PT services to address deficits in strength, range of motion, and pain in order to return to full function at home, work, and with leisure activities such  as riding his motorcycle.    REHAB POTENTIAL: Excellent  CLINICAL DECISION MAKING: Stable/uncomplicated  EVALUATION COMPLEXITY: Low   GOALS: Goals reviewed with patient? Yes  SHORT TERM GOALS: Target date: 07/06/2022  Pt will be independent with HEP to improve strength and decrease neck pain to improve pain-free function at home and work. Baseline:  Goal status: INITIAL   LONG TERM GOALS: Target date: 08/03/2022  Pt will increase FOTO to at least 66 to demonstrate significant improvement in function at home and work related to neck pain  Baseline: 05/22/22: 53; 06/05/22: 72 Goal status: ACHIEVED  2.  Pt will decrease worst neck pain by at least 2 points on the NPRS in order to demonstrate clinically significant reduction in neck pain. Baseline: 05/22/22: Worst: 5/10 Goal status: DEFERRED  3.  Pt will decrease NDI score to less than 5% in order demonstrate clinically significant reduction in neck pain/disability.       Baseline: 05/22/22: 16%; 06/05/22: 12% Goal status: PARTIALLY MET  4.  Pt will demonstrate improvement in bilateral cervical rotation to at least 60 degrees without pain in order to turn his head while driving and riding his motorcycle without pain Baseline: 05/22/22: R: 50 degrees, L: 25 degrees, both painful; 06/05/22: R: 72 degrees, L: 68 degrees, both pain-free; Goal status: ACHIEVED  PLAN: PT FREQUENCY: 1-2x/week  PT DURATION: 8 weeks  PLANNED INTERVENTIONS: Therapeutic exercises, Therapeutic activity, Neuromuscular re-education, Balance training, Gait training, Patient/Family education, Joint manipulation, Joint mobilization, Vestibular training, Canalith repositioning, Dry Needling, Electrical stimulation, Spinal manipulation, Spinal mobilization, Cryotherapy, Moist heat, Taping, Traction, Ultrasound, Ionotophoresis 46m/ml Dexamethasone, and Manual therapy  PLAN FOR NEXT SESSION: DISCHARGE  JLyndel SafeHuprich PT, DPT, G[facility]  Verdia Bolt 06/08/2022, 9:30 AM

## 2022-05-29 ENCOUNTER — Other Ambulatory Visit: Payer: Self-pay | Admitting: Internal Medicine

## 2022-05-29 ENCOUNTER — Ambulatory Visit: Payer: BC Managed Care – PPO

## 2022-05-29 DIAGNOSIS — M25561 Pain in right knee: Secondary | ICD-10-CM | POA: Diagnosis not present

## 2022-05-29 DIAGNOSIS — M542 Cervicalgia: Secondary | ICD-10-CM | POA: Diagnosis not present

## 2022-05-29 DIAGNOSIS — M545 Low back pain, unspecified: Secondary | ICD-10-CM | POA: Diagnosis not present

## 2022-05-29 DIAGNOSIS — G8929 Other chronic pain: Secondary | ICD-10-CM | POA: Diagnosis not present

## 2022-05-31 ENCOUNTER — Encounter: Payer: Self-pay | Admitting: Internal Medicine

## 2022-06-01 ENCOUNTER — Encounter: Payer: Self-pay | Admitting: Internal Medicine

## 2022-06-01 ENCOUNTER — Telehealth (INDEPENDENT_AMBULATORY_CARE_PROVIDER_SITE_OTHER): Payer: BC Managed Care – PPO | Admitting: Internal Medicine

## 2022-06-01 DIAGNOSIS — J019 Acute sinusitis, unspecified: Secondary | ICD-10-CM | POA: Diagnosis not present

## 2022-06-01 MED ORDER — PREDNISONE 20 MG PO TABS: 20.0000 mg | ORAL_TABLET | Freq: Every day | ORAL | 0 refills | Status: AC

## 2022-06-01 MED ORDER — AMOXICILLIN-POT CLAVULANATE 875-125 MG PO TABS: 1.0000 | ORAL_TABLET | Freq: Two times a day (BID) | ORAL | 0 refills | Status: DC

## 2022-06-01 NOTE — Assessment & Plan Note (Signed)
Symptoms present for 3 weeks . Given chronicity of symptoms, development of facial pain , his history is consistent with bacterial URI,  Will treat with empiric antibiotics, decongestants, prednisone taper   Probiotic advised

## 2022-06-01 NOTE — Progress Notes (Signed)
Virtual Visit converted to Telephone  Note    This format is felt to be most appropriate for this patient at this time.  All issues noted in this document were discussed and addressed.  No physical exam was performed (except for noted visual exam findings with Video Visits).   I connected withNAME on 06/01/22 at  4:30 PM EDT by a video enabled telemedicine application and verified that I am speaking with the correct person using two identifiers. Location patient: home Location provider: work or home office Persons participating in the virtual visit: patient, provider  I discussed the limitations, risks, security and privacy concerns of performing an evaluation and management service by telephone and the availability of in person appointments. I also discussed with the patient that there may be a patient responsible charge related to this service. The patient expressed understanding and agreed to proceed.  Interactive audio and video telecommunications were attempted between this provider and patient, however failed, due to patient having technical difficulties   We continued and completed visit with audio only.   Reason for visit: sinusitis   HPI:  51 yr old smoker presents with 3 week history of sinus congestion with persistent drainage,  now having constant pressure behind left eye and tenderness to palpation of his  left forehead.  Has been self treating  with Sudafed , mucinex,   ibuprofen with no significant chagne    ROS: See pertinent positives and negatives per HPI.  Past Medical History:  Diagnosis Date   Allergy    Arthritis    Asthma    Depression    Diabetes mellitus without complication ([facility])    History of chicken pox     No past surgical history on file.  Family History  Problem Relation Age of Onset   Arthritis Mother    Hyperlipidemia Mother    Arthritis Father    Hypertension Father    Diabetes Father    Mental illness Maternal Aunt    Hyperlipidemia  Maternal Aunt    Alcohol abuse Maternal Aunt    Mental illness Paternal Aunt    Alcohol abuse Paternal Aunt    Arthritis Paternal Uncle    Arthritis Maternal Grandmother    Arthritis Paternal Grandmother    Hyperlipidemia Paternal Grandmother    Diabetes Paternal Grandmother     SOCIAL HX:  reports that he has been smoking cigarettes. He has a 10.00 pack-year smoking history. He has never used smokeless tobacco. He reports current alcohol use. He reports that he does not use drugs.    Current Outpatient Medications:    amoxicillin-clavulanate (AUGMENTIN) 875-125 MG tablet, Take 1 tablet by mouth 2 (two) times daily., Disp: 20 tablet, Rfl: 0   empagliflozin (JARDIANCE) 10 MG TABS tablet, Take 1 tablet (10 mg total) by mouth daily before breakfast., Disp: 30 tablet, Rfl: 1   insulin degludec (TRESIBA FLEXTOUCH) 100 UNIT/ML FlexTouch Pen, Inject 10 Units into the skin daily., Disp: 3 mL, Rfl: 3   loratadine (CLARITIN) 10 MG tablet, Take 10 mg by mouth daily., Disp: , Rfl:    predniSONE (DELTASONE) 20 MG tablet, Take 1 tablet (20 mg total) by mouth daily with breakfast for 5 doses., Disp: 5 tablet, Rfl: 0   rosuvastatin (CRESTOR) 40 MG tablet, TAKE 1 TABLET(40 MG) BY MOUTH DAILY, Disp: 90 tablet, Rfl: 1   tiZANidine (ZANAFLEX) 4 MG tablet, TAKE 1 TABLET(4 MG) BY MOUTH DAILY IN THE EVENING AS NEEDED FOR MUSCLE SPASMS. DO NOT TAKE AND GO DRIVING  OR OPERATE MACHINERY, Disp: 30 tablet, Rfl: 0   vitamin B-12 (CYANOCOBALAMIN) 100 MCG tablet, Take 100 mcg by mouth daily., Disp: , Rfl:   EXAM:   General impression: alert, cooperative and articulate.  No signs of being in distress  Lungs: speech is fluent sentence length suggests that patient is not short of breath and not punctuated by cough, sneezing or sniffing. Marland Kitchen   Psych: affect normal.  speech is articulate and non pressured .  Denies suicidal thoughts   ASSESSMENT AND PLAN:  Discussed the following assessment and plan:  Acute sinusitis,  recurrence not specified, unspecified location  Acute sinusitis Symptoms present for 3 weeks . Given chronicity of symptoms, development of facial pain , his history is consistent with bacterial URI,  Will treat with empiric antibiotics, decongestants, prednisone taper   Probiotic advised     I discussed the assessment and treatment plan with the patient. The patient was provided an opportunity to ask questions and all were answered. The patient agreed with the plan and demonstrated an understanding of the instructions.   The patient was advised to call back or seek an in-person evaluation if the symptoms worsen or if the condition fails to improve as anticipated.   I spent  15 minutes dedicated to the care of this patient on the date of this encounter to include pre-visit review of his medical history   in a non Face-to-face visit with the patient , and post visit ordering of testing and therapeutics.    Sherlene Shams, MD

## 2022-06-05 ENCOUNTER — Ambulatory Visit: Payer: BC Managed Care – PPO

## 2022-06-05 DIAGNOSIS — M542 Cervicalgia: Secondary | ICD-10-CM

## 2022-06-05 DIAGNOSIS — M545 Low back pain, unspecified: Secondary | ICD-10-CM | POA: Diagnosis not present

## 2022-06-05 DIAGNOSIS — G8929 Other chronic pain: Secondary | ICD-10-CM | POA: Diagnosis not present

## 2022-06-05 DIAGNOSIS — M25561 Pain in right knee: Secondary | ICD-10-CM | POA: Diagnosis not present

## 2022-06-05 NOTE — Therapy (Signed)
OUTPATIENT PHYSICAL THERAPY KNEE EVALUATION   Patient Name: Edward Spencer MRN: [id number] DOB:1971-04-16, 51 y.o., male Today's Date: 06/08/2022   PT End of Session - 06/08/22 0845     Visit Number 1    Number of Visits 17    Date for PT Re-Evaluation 07/31/22    Authorization Type knee eval: 06/05/22    PT Start Time 1045    PT Stop Time 1130    PT Time Calculation (min) 45 min    Activity Tolerance Patient tolerated treatment well    Behavior During Therapy WFL for tasks assessed/performed             Past Medical History:  Diagnosis Date   Allergy    Arthritis    Asthma    Depression    Diabetes mellitus without complication (HCC)    History of chicken pox    History reviewed. No pertinent surgical history. Patient Active Problem List   Diagnosis Date Noted   Right knee pain 04/23/2022   Neck pain 02/13/2022   Stress 07/17/2021   Back pain 06/12/2021   Vaccine counseling 02/02/2021   Thrombocytopenia (HCC) 01/29/2021   Fatigue 05/25/2020   Colon cancer screening 05/25/2020   Decreased hearing 12/09/2019   Acute sinusitis 05/03/2019   COVID-19 virus detected 04/23/2019   Hypercholesterolemia 02/05/2019   Headache 02/02/2016   Health care maintenance 09/29/2015   Lateral epicondylitis 08/19/2015   Right elbow pain 08/11/2015   Left shoulder pain 08/11/2015   Environmental allergies 08/11/2015   Diabetes mellitus (HCC) 08/09/2015    PCP: Dale [place], MD  REFERRING PROVIDER: Dale [place], MD  REFERRING DIAGNOSIS: R knee pain  THERAPY DIAG: Cervicalgia  RATIONALE FOR EVALUATION AND TREATMENT: Rehabilitation  ONSET DATE: 03/06/22 (approximate)  FOLLOW UP APPT WITH PROVIDER: Yes, returns to see Dr. Lorin Picket on 06/15/22   SUBJECTIVE:                                                                                                                                                                                         Chief Complaint: Lateral R knee  pain  Pertinent History Pt reports R lateral knee pain x 3 months. He stepped out of his truck, took a step, and his L knee folded underneath him. Pt fell to the ground but denies any additional injury. Pain has been persistent since that time. He gets intermittent swelling "under my knee cap." Pt reports that occasionally with extended standing his R knee and calf "fall asleep." He experiences the most pain with steps, entering/exiting truck, deep squats, and extended standing. He denies any additional knee buckling but does get occasional  locking of his R knee. He has been having a lot of popping in both his knees.   Pain:  Pain Intensity: Present: 0/10, Best: 0/10, Worst: 5/10 Pain location: R lateral knee (pt points to area around LCL) Pain quality: occasionally sharp but mostly dull; Radiating pain: No  Swelling: Yes, under the knee cap Popping, catching, locking: Yes, popping in both knees Numbness/Tingling: Yes, with extended standing R knee and calf "falls asleep" Focal weakness or buckling: Yes, initial buckle with continued weaknees Aggravating factors: up/down stairs, in/out of truck, deep squat, no additional buckling, extended standing Relieving factors: wearing knee brace, ibuprofen, ice, exercises 24-hour pain behavior: more at the end of the day History of prior back, hip, or knee injury, pain, surgery, or therapy: Yes, 10-15 years ago when in [employer] he had some injuries which improved Dominant hand: right Imaging: Yes, R knee radiographs 04/23/22: Joint spaces are preserved. Minimal peripheral medial compartment and patellar degenerative osteophytes. No joint effusion. No acute fracture or dislocation. 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  Recent memory is intact.  Remote memory is intact.  Attention span and concentration are intact.  Expressive speech is intact.  Patient's fund of knowledge is within normal limits for educational level.     Gross Musculoskeletal Assessment Tremor: None Bulk: Normal Tone: Normal   GAIT: No gross deficits in gait noted;   Posture: No gross deficits in posture noted   AROM  AROM (Normal range in degrees) AROM  06/05/2022  Lumbar   Flexion (65)   Extension (30)   Right lateral flexion (25)   Left lateral flexion (25)   Right rotation (30)   Left rotation (30)       Hip Right Left  Flexion (125) WNL WNL  Extension (15)    Abduction (40)    Adduction     Internal Rotation (45)    External Rotation (45)        Knee    Flexion (135) 109 130  Extension (0) 0 0      Ankle    Dorsiflexion (20)    Plantarflexion (50)    Inversion (35)    Eversion (15    (* = pain; Blank rows = not tested)   LE MMT:  MMT (out of 5) Right 06/08/2022 Left 06/08/2022  Hip flexion 5 5  Hip extension 4+ 4  Hip abduction 4 3+  Hip adduction 3+ 4  Hip internal rotation 4* "tearing" 5  Hip external rotation 4* "tearing" 5  Knee flexion 5 5  Knee extension 5 5  Ankle dorsiflexion 5 5  Ankle plantarflexion active active  Ankle inversion    Ankle eversion    (* = pain; Blank rows = not tested)   Sensation Grossly intact to light touch bilateral LEs as determined by testing dermatomes L2-S2. Proprioception, and hot/cold testing deferred on this date.   Reflexes Deferred   Muscle  Length Hamstrings: R: Positive for tightness  at 55 degrees L: Positive for tightness at 60 degrees Quadriceps Michela Pitcher): R: Negative L: Positive for shortened length Hip flexors Maisie Fus): R: Not examined L: Not examined IT band Claiborne Rigg): R: Not examined L: Not examined   Palpation  Location LEFT  RIGHT           Quadriceps  0  Medial Hamstrings  0  Lateral Hamstrings  0  Lateral Hamstring tendon  0  Medial Hamstring tendon  0  Quadriceps tendon  0  Patella  1  Patellar Tendon  0  Tibial Tuberosity  0  Medial joint line  1  Lateral joint line  1  MCL  1  LCL  2  Adductor Tubercle  0  Pes Anserine tendon  1  Infrapatellar fat pad  1  Fibular head  0  Popliteal fossa  0  (Blank rows = not tested) Graded on 0-4 scale (0 = no pain, 1 = pain, 2 = pain with wincing/grimacing/flinching, 3 = pain with withdrawal, 4 = unwilling to allow palpation), (Blank rows = not tested)   Passive Accessory Motion Deferred  VASCULAR Deferred   SPECIAL TESTS  Ligamentous Stability  ACL: Lachman's: R: Negative L: Negative Active Lachman's: R: Not examined L: Not examined Anterior Drawer: R: Negative L: Negative Pivot Shift: R: Positive for pain L: Negative  PCL: Posterior Drawer: R: Negative L: Negative Reverse Lachman's: R: Not examined L: Not examined Posterior Sag Sign: R: Negative L: Negative  MCL: Valgus Stress (30 degrees flexion): R: Positive for pain, no instability L: Negative  LCL: Varus Stress (30 degrees flexion): R: Positive for pain, no instability L: Negative  Meniscus Tests McMurray's Test:  Medial Meniscus (Tibial ER): R: Positive for pain L: Negative Lateral Meniscus (Tibial IR): R: Positive for pain L: Negative  IR worse during squat, ER also painful on R  Thessaly: R: Positive L: Not examined Ege's: R: Positive L: Not examined Apley's: R: Not examined L: Not examined Steinmann Sign I: R: Not examined L: Not examined Steinmann Sign II: R: Not examined L: Not  examined Effusion: R: Negative L: Negative    Patellofemoral Pain Syndrome Patellar Tilt (Lateral): R: Negative L: Negative Patellar Apprehension: R: Not examined L: Not examined Squatting pain: R: Positive L: Negative Stair climbing pain: R: Positive L: Negative Kneeling pain: R: Positive L: Negative Resisted knee extension pain: R: Negative L: Negative Compression: R: Positive L: Not examined Clarke's sign: R: Positive L: Not examined Lateral Pull: R: Not examined L: Not examined   Patellar Tendinopathy Inferior pole palpation with anterior tilt: R: Negative L: Negative    Beighton Scale: Deferred  [place] Knee Rules for Acute Knee injury  Yes/No         1. Aged 55 years or over No   2. Tenderness at the head of the fibula No   3. Isolated tenderness of the patella  No   4. Inability to flex knee to 90 degrees No   5. Inability to bear weight (defined as an inability to take 4 steps, ie two steps on each leg, regardless of limping) immediately and at presentation No      TODAY'S TREATMENT  None   PATIENT EDUCATION:  Education details: Plan of care Person educated: Patient Education method: Explanation Education comprehension: verbalized understanding   HOME EXERCISE PROGRAM: No formal HEP issued. Pt encouraged to continue R single leg activities in the gym including leg press and squat. Full HEP will be issued at next visit.  ASSESSMENT:  CLINICAL IMPRESSION: Patient is a 51 y.o. male who was seen today for physical therapy evaluation and treatment for knee pain. Objective impairments include decreased ROM, decreased strength, and pain. Findings concerning for meniscus involvement include limited R knee flexion ROM, end range flexion/extension pain, medial/lateral joint line tenderness, positive Thessaly, and positive Ege Test. These impairments are limiting patient from community activity and occupation. Personal factors including Time since onset of  injury/illness/exacerbation and 1 comorbidity: DM  are also affecting patient's functional outcome. Patient will benefit from skilled PT to address above impairments and improve overall function.  REHAB POTENTIAL: Good  CLINICAL DECISION MAKING: Stable/uncomplicated  EVALUATION COMPLEXITY: Low   GOALS: Goals reviewed with patient? Yes  SHORT TERM GOALS: Target date: 07/04/2022  Pt will be independent with HEP to improve strength and decrease knee pain to improve pain-free function at home and work. Baseline:  Goal status: INITIAL   LONG TERM GOALS: Target date: 08/03/2022  Pt will increase FOTO to at least 71 to demonstrate significant improvement in function at home and work related to knee pain  Baseline: 06/05/22: 54 Goal status: INITIAL  2.  Pt will decrease worst knee pain by at least 3 points on the NPRS in order to demonstrate clinically significant reduction in back pain. Baseline: 06/05/22: 5/10; Goal status: INITIAL  3.  Pt will decrease LEFS score by at least 9 points in order demonstrate clinically significant reduction in knee pain/disability.       Baseline: 06/05/22: 47/80 Goal status: INITIAL  4.  Pt will report at least 75% improvement on R knee pain in order to improve function at home, work, and with leisure activities with less pain; Baseline:  Goal status: INITIAL   PLAN: PT FREQUENCY: 1-2x/week  PT DURATION: 8 weeks  PLANNED INTERVENTIONS: Therapeutic exercises, Therapeutic activity, Neuromuscular re-education, Balance training, Gait training, Patient/Family education, Joint manipulation, Joint mobilization, Canalith repositioning, Aquatic Therapy, Dry Needling, Electrical stimulation, Spinal manipulation, Spinal mobilization, Cryotherapy, Moist heat, Taping, Traction, Ultrasound, Ionotophoresis 4mg /ml Dexamethasone, and Manual therapy  PLAN FOR NEXT SESSION: Initiate HEP, manual techniques and strengthening for R knee  Naziah Portee D Akari [employer] PT, DPT, [facility]   Rayman Petrosian 06/08/2022, 9:22 AM

## 2022-06-08 ENCOUNTER — Telehealth: Payer: Self-pay

## 2022-06-08 NOTE — Telephone Encounter (Signed)
Pt advised due to 2000$ deductible copay for [employer] still high. Must meet deductible.

## 2022-06-12 ENCOUNTER — Ambulatory Visit: Payer: BC Managed Care – PPO

## 2022-06-12 DIAGNOSIS — G8929 Other chronic pain: Secondary | ICD-10-CM | POA: Diagnosis not present

## 2022-06-12 DIAGNOSIS — M545 Low back pain, unspecified: Secondary | ICD-10-CM | POA: Diagnosis not present

## 2022-06-12 DIAGNOSIS — M542 Cervicalgia: Secondary | ICD-10-CM | POA: Diagnosis not present

## 2022-06-12 DIAGNOSIS — M25561 Pain in right knee: Secondary | ICD-10-CM | POA: Diagnosis not present

## 2022-06-12 NOTE — Therapy (Signed)
OUTPATIENT PHYSICAL THERAPY KNEE TREATMENT   Patient Name: Edward Spencer MRN: [id number] DOB:Jul 28, 1971, 51 y.o., male Today's Date: 06/12/2022   PT End of Session - 06/12/22 1047     Visit Number 2    Number of Visits 17    Date for PT Re-Evaluation 07/31/22    Authorization Type knee eval: 06/05/22    PT Start Time 1050    PT Stop Time 1135    PT Time Calculation (min) 45 min    Activity Tolerance Patient tolerated treatment well    Behavior During Therapy WFL for tasks assessed/performed             Past Medical History:  Diagnosis Date   Allergy    Arthritis    Asthma    Depression    Diabetes mellitus without complication ([place])    History of chicken pox    History reviewed. No pertinent surgical history. Patient Active Problem List   Diagnosis Date Noted   Right knee pain 04/23/2022   Neck pain 02/13/2022   Stress 07/17/2021   Back pain 06/12/2021   Vaccine counseling 02/02/2021   Thrombocytopenia ([place]) 01/29/2021   Fatigue 05/25/2020   Colon cancer screening 05/25/2020   Decreased hearing 12/09/2019   Acute sinusitis 05/03/2019   COVID-19 virus detected 04/23/2019   Hypercholesterolemia 02/05/2019   Headache 02/02/2016   Health care maintenance 09/29/2015   Lateral epicondylitis 08/19/2015   Right elbow pain 08/11/2015   Left shoulder pain 08/11/2015   Environmental allergies 08/11/2015   Diabetes mellitus ([place]) 08/09/2015    PCP: Einar Pheasant, MD  REFERRING PROVIDER: Einar Pheasant, MD  REFERRING DIAGNOSIS: R knee pain  THERAPY DIAG: Chronic pain of right knee  RATIONALE FOR EVALUATION AND TREATMENT: Rehabilitation  ONSET DATE: 03/06/22 (approximate)  FOLLOW UP APPT WITH PROVIDER: Yes, returns to see Dr. Nicki Reaper on 06/15/22   FROM INITIAL EVALUATION  SUBJECTIVE:                                                                                                                                                                                          Chief Complaint: Lateral R knee pain  Pertinent History Pt reports R lateral knee pain x 3 months. He stepped out of his truck, took a step, and his L knee folded underneath him. Pt fell to the ground but denies any additional injury. Pain has been persistent since that time. He gets intermittent swelling "under my knee cap." Pt reports that occasionally with extended standing his R knee and calf "fall asleep." He experiences the most pain with steps, entering/exiting truck, deep squats, and extended standing. He denies  any additional knee buckling but does get occasional locking of his R knee. He has been having a lot of popping in both his knees.   Pain:  Pain Intensity: Present: 0/10, Best: 0/10, Worst: 5/10 Pain location: R lateral knee (pt points to area around LCL) Pain quality: occasionally sharp but mostly dull; Radiating pain: No  Swelling: Yes, under the knee cap Popping, catching, locking: Yes, popping in both knees Numbness/Tingling: Yes, with extended standing R knee and calf "falls asleep" Focal weakness or buckling: Yes, initial buckle with continued weaknees Aggravating factors: up/down stairs, in/out of truck, deep squat, no additional buckling, extended standing Relieving factors: wearing knee brace, ibuprofen, ice, exercises 24-hour pain behavior: more at the end of the day History of prior back, hip, or knee injury, pain, surgery, or therapy: Yes, 10-15 years ago when in [employer] he had some injuries which improved Dominant hand: right Imaging: Yes, R knee radiographs 04/23/22: Joint spaces are preserved. Minimal peripheral medial compartment and patellar degenerative osteophytes. No joint effusion. No acute fracture or dislocation. 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  Recent memory is intact.  Remote memory is intact.  Attention span and concentration are intact.  Expressive speech is intact.  Patient's fund of knowledge is within normal limits for educational level.     Gross Musculoskeletal Assessment Tremor: None Bulk: Normal Tone: Normal   GAIT: No gross deficits in gait noted;   Posture: No gross deficits in posture noted   AROM  AROM (Normal range in degrees) AROM  06/05/2022  Lumbar   Flexion (65)   Extension (30)   Right lateral flexion (25)   Left lateral flexion (25)   Right rotation (30)   Left rotation (30)       Hip Right Left  Flexion (125) WNL WNL  Extension (15)    Abduction (40)    Adduction     Internal Rotation (45) 50 25  External Rotation (45) 25 45      Knee    Flexion (135) 109 130  Extension (0) 0 0      Ankle    Dorsiflexion (20)    Plantarflexion (50)    Inversion (35)    Eversion (15    (* = pain; Blank rows = not tested)   LE MMT:  MMT (out of 5) Right 06/12/2022 Left 06/12/2022  Hip flexion 5 5  Hip extension 4+ 4  Hip abduction 4 3+  Hip adduction 3+ 4  Hip internal rotation 4* "tearing" 5  Hip external rotation 4* "tearing" 5  Knee flexion 5 5  Knee extension 5 5  Ankle dorsiflexion 5 5  Ankle plantarflexion active active  Ankle inversion    Ankle eversion    (* = pain; Blank rows = not tested)   Sensation Grossly intact to light touch bilateral LEs as determined by testing dermatomes L2-S2. Proprioception, and hot/cold testing deferred  on this date.   Reflexes Deferred  Muscle Length Hamstrings: R: Positive for tightness at 55 degrees L: Positive for tightness at 60 degrees Quadriceps Pat Patrick): R: Negative L: Positive for shortened length Hip flexors Marcello Moores): R: Not examined L: Not examined IT band Nicoletta Dress): R: Not examined L: Not examined   Palpation  Location LEFT  RIGHT           Quadriceps  0  Medial Hamstrings  0  Lateral Hamstrings  0  Lateral Hamstring tendon  0  Medial Hamstring tendon  0  Quadriceps tendon  0  Patella  1  Patellar Tendon  0  Tibial Tuberosity  0  Medial joint line  1  Lateral joint line  1  MCL  1  LCL  2  Adductor Tubercle  0  Pes Anserine tendon  1  Infrapatellar fat pad  1  Fibular head  0  Popliteal fossa  0  (Blank rows = not tested) Graded on 0-4 scale (0 = no pain, 1 = pain, 2 = pain with wincing/grimacing/flinching, 3 = pain with withdrawal, 4 = unwilling to allow palpation), (Blank rows = not tested)   Passive Accessory Motion Deferred  VASCULAR Deferred   SPECIAL TESTS  Ligamentous Stability  ACL: Lachman's: R: Negative L: Negative Active Lachman's: R: Not examined L: Not examined Anterior Drawer: R: Negative L: Negative Pivot Shift: R: Positive for pain L: Negative  PCL: Posterior Drawer: R: Negative L: Negative Reverse Lachman's: R: Not examined L: Not examined Posterior Sag Sign: R: Negative L: Negative  MCL: Valgus Stress (30 degrees flexion): R: Positive for pain, no instability L: Negative  LCL: Varus Stress (30 degrees flexion): R: Positive for pain, no instability L: Negative  Meniscus Tests McMurray's Test:  Medial Meniscus (Tibial ER): R: Positive for pain L: Negative Lateral Meniscus (Tibial IR): R: Positive for pain L: Negative  IR worse during squat, ER also painful on R  Thessaly: R: Positive L: Not examined Ege's: R: Positive L: Not examined Apley's: R: Not examined L: Not examined Steinmann Sign I: R: Not examined L: Not  examined Steinmann Sign II: R: Not examined L: Not examined Effusion: R: Negative L: Negative    Patellofemoral Pain Syndrome Patellar Tilt (Lateral): R: Negative L: Negative Patellar Apprehension: R: Not examined L: Not examined Squatting pain: R: Positive L: Negative Stair climbing pain: R: Positive L: Negative Kneeling pain: R: Positive L: Negative Resisted knee extension pain: R: Negative L: Negative Compression: R: Positive L: Not examined Clarke's sign: R: Positive L: Not examined Lateral Pull: R: Not examined L: Not examined   Patellar Tendinopathy Inferior pole palpation with anterior tilt: R: Negative L: Negative    Beighton Scale: Deferred  [place] Knee Rules for Acute Knee injury  Yes/No         1. Aged 55 years or over No   2. Tenderness at the head of the fibula No   3. Isolated tenderness of the patella  No   4. Inability to flex knee to 90 degrees No   5. Inability to bear weight (defined as an inability to take 4 steps, ie two steps on each leg, regardless of limping) immediately and at presentation No      TODAY'S TREATMENT   SUBJECTIVE: Pt reports that he is doing well today. Denies any resting R knee pain upon arrival. Continues exercising at home with muscle soreness but no pain. No specific questions or concerns currently. He will be gone next week on a motorcycle trip.  PAIN: Denies  Ther-ex  Hip ROM measurement obtained; Hooklying R single leg figure 4 bridge 2 x 10; Swiss ball bridges, arms at side x 10, arms across chest x 10; Hooklying clams with blue band resistance 2 x 10; Hooklying adductor isometric ball squeeze 2 x 10; L sidelying R hip abduction with manual resistance from therapist 2 x 10; Total Gym (TG) Level 22 (L22) double leg squats x 10; TG L22 R single leg squats with dynadisc under foot, resistance provided to lateral R knee to encourage glut activation and prevent valgus 2 x 10; Standing bilateral calf step stretch x 30s; HEP  issued and reviewed with patient;   Manual Therapy  R hamstring MET stretch 5s contract/5s relax x 3, repeated twice; R hip FABER stretch 2 x 30s; R tibia on femur anterior to posterior mobilizations at end range flexion, grade I-II, 30s/bout x 3 bouts;   PATIENT EDUCATION:  Education details: Pt educated throughout session about proper posture and technique with exercises. Improved exercise technique, movement at target joints, use of target muscles after min to mod verbal, visual, tactile cues. HEP Person educated: Patient Education method: Explanation, verbal cues, tactile cues, and handout Education comprehension: verbalized understanding and returned demonstration   HOME EXERCISE PROGRAM: Access Code: [facility] URL: [email or web address] Date: 06/12/2022 Prepared by: Roxana Hires  Exercises - Standing Bilateral Gastroc Stretch with Step  - 2 x daily - 7 x weekly - 3 reps - 30-45s hold - Seated Hip External Rotation Stretch (Mirrored)  - 2 x daily - 7 x weekly - 3 reps - 30-45s hold - Seated Hamstring Stretch  - 2 x daily - 7 x weekly - 3 reps - 30-45s hold - Clamshell with Resistance  - 1 x daily - 7 x weekly - 2 sets - 10 reps - 3s hold - Single Leg Squat with Chair Touch  - 1 x daily - 7 x weekly - 2 sets - 10 reps   ASSESSMENT:  CLINICAL IMPRESSION: Patient reports no pain upon arrival or during session today. Introduced MET R hamstring stretches and pt demonstrates improved extensibility as stretches progress. Also introduced external rotation stretches and PAMs of R tibia on femur. Performed progressive strengthening of R hip and knee and issued HEP. Pt encouraged to follow-up as scheduled. Pt will benefit from PT services to address deficits in strength, range of motion, and pain in order to return to full function at home and work.  REHAB POTENTIAL: Good  CLINICAL DECISION MAKING: Stable/uncomplicated  EVALUATION COMPLEXITY: Low   GOALS: Goals  reviewed with patient? Yes  SHORT TERM GOALS: Target date: 07/04/2022  Pt will be independent with HEP to improve strength and decrease knee pain to improve pain-free function at home and work. Baseline:  Goal status: INITIAL   LONG TERM GOALS: Target date: 08/03/2022  Pt will increase FOTO to at least 71 to demonstrate significant improvement in function at home and work related to knee pain  Baseline: 06/05/22: 54 Goal status: INITIAL  2.  Pt will decrease worst knee pain by at least 3 points on the NPRS in order to demonstrate clinically significant reduction in back pain. Baseline: 06/05/22: 5/10; Goal status: INITIAL  3.  Pt will decrease LEFS score by at least 9 points in order demonstrate clinically significant reduction in knee pain/disability.       Baseline: 06/05/22: 47/80 Goal status: INITIAL  4.  Pt will report at least 75% improvement on R knee pain in order to improve function at home,  work, and with leisure activities with less pain; Baseline:  Goal status: INITIAL   PLAN: PT FREQUENCY: 1-2x/week  PT DURATION: 8 weeks  PLANNED INTERVENTIONS: Therapeutic exercises, Therapeutic activity, Neuromuscular re-education, Balance training, Gait training, Patient/Family education, Joint manipulation, Joint mobilization, Canalith repositioning, Aquatic Therapy, Dry Needling, Electrical stimulation, Spinal manipulation, Spinal mobilization, Cryotherapy, Moist heat, Taping, Traction, Ultrasound, Ionotophoresis 29m/ml Dexamethasone, and Manual therapy  PLAN FOR NEXT SESSION: Initiate HEP, manual techniques and strengthening for R knee  JLyndel SafeHuprich PT, DPT, GCS  Kaleigh Spiegelman 06/12/2022, 1:41 PM

## 2022-06-15 ENCOUNTER — Ambulatory Visit (INDEPENDENT_AMBULATORY_CARE_PROVIDER_SITE_OTHER): Payer: BC Managed Care – PPO | Admitting: Internal Medicine

## 2022-06-15 ENCOUNTER — Other Ambulatory Visit: Payer: Self-pay | Admitting: Internal Medicine

## 2022-06-15 ENCOUNTER — Encounter: Payer: Self-pay | Admitting: Internal Medicine

## 2022-06-15 DIAGNOSIS — E1165 Type 2 diabetes mellitus with hyperglycemia: Secondary | ICD-10-CM

## 2022-06-16 ENCOUNTER — Other Ambulatory Visit: Payer: Self-pay | Admitting: Internal Medicine

## 2022-06-18 ENCOUNTER — Encounter: Payer: Self-pay | Admitting: Internal Medicine

## 2022-06-18 ENCOUNTER — Ambulatory Visit (INDEPENDENT_AMBULATORY_CARE_PROVIDER_SITE_OTHER): Payer: BC Managed Care – PPO | Admitting: Internal Medicine

## 2022-06-18 DIAGNOSIS — E1165 Type 2 diabetes mellitus with hyperglycemia: Secondary | ICD-10-CM

## 2022-06-18 DIAGNOSIS — E7800 Pure hypercholesterolemia, unspecified: Secondary | ICD-10-CM

## 2022-06-18 DIAGNOSIS — D696 Thrombocytopenia, unspecified: Secondary | ICD-10-CM | POA: Diagnosis not present

## 2022-06-18 DIAGNOSIS — F439 Reaction to severe stress, unspecified: Secondary | ICD-10-CM

## 2022-06-18 DIAGNOSIS — M25561 Pain in right knee: Secondary | ICD-10-CM

## 2022-06-18 DIAGNOSIS — J019 Acute sinusitis, unspecified: Secondary | ICD-10-CM | POA: Diagnosis not present

## 2022-06-18 MED ORDER — AMOXICILLIN-POT CLAVULANATE 875-125 MG PO TABS: 1.0000 | ORAL_TABLET | Freq: Two times a day (BID) | ORAL | 0 refills | Status: DC

## 2022-06-18 MED ORDER — EMPAGLIFLOZIN 25 MG PO TABS: 25.0000 mg | ORAL_TABLET | Freq: Every day | ORAL | 3 refills | Status: DC

## 2022-06-18 NOTE — Progress Notes (Signed)
Patient ID: Edward Spencer, male   DOB: August 01, 1971, 51 y.o.   MRN: [id number]   Subjective:    Patient ID: Edward Spencer, male    DOB: Apr 20, 1971, 51 y.o.   MRN: [id number]   Patient here for a scheduled follow up.   Chief Complaint  Patient presents with   Diabetes   .   HPI Recently evaluated and diagnosed with sinus infection.  Treated with augmentin.  Better.  Feels not completely resolved.  Still with nasal congestion.  Some productive mucus.  No chest pain or sob reported.  Taking sudafed.  Using flonase.  No acid reflux reported.  No abdominal pain.  Bowels moving.  Has had issues with knee.  PT helping.  On jardiance. Sugars doing better.  A few episodes of low sugar - blood sugar 90.  Has been on tresiba.     Past Medical History:  Diagnosis Date   Allergy    Arthritis    Asthma    Depression    Diabetes mellitus without complication ([place])    History of chicken pox    History reviewed. No pertinent surgical history. Family History  Problem Relation Age of Onset   Arthritis Mother    Hyperlipidemia Mother    Arthritis Father    Hypertension Father    Diabetes Father    Mental illness Maternal Aunt    Hyperlipidemia Maternal Aunt    Alcohol abuse Maternal Aunt    Mental illness Paternal Aunt    Alcohol abuse Paternal Aunt    Arthritis Paternal Uncle    Arthritis Maternal Grandmother    Arthritis Paternal Grandmother    Hyperlipidemia Paternal Grandmother    Diabetes Paternal Grandmother    Social History   Socioeconomic History   Marital status: Single    Spouse name: Not on file   Number of children: Not on file   Years of education: Not on file   Highest education level: Not on file  Occupational History   Not on file  Tobacco Use   Smoking status: Every Day    Packs/day: 0.25    Years: 40.00    Total pack years: 10.00    Types: Cigarettes   Smokeless tobacco: Never  Vaping Use   Vaping Use: Never used  Substance and Sexual Activity   Alcohol  use: Yes    Alcohol/week: 0.0 standard drinks of alcohol    Comment: social   Drug use: No   Sexual activity: Not on file  Other Topics Concern   Not on file  Social History Narrative   Not on file   Social Determinants of Health   Financial Resource Strain: Low Risk  (09/26/2021)   Overall Financial Resource Strain (CARDIA)    Difficulty of Paying Living Expenses: Not hard at all  Food Insecurity: Not on file  Transportation Needs: Not on file  Physical Activity: Not on file  Stress: Not on file  Social Connections: Not on file     Review of Systems  Constitutional:  Negative for appetite change and unexpected weight change.  HENT:  Negative for congestion and sinus pressure.   Respiratory:  Negative for cough, chest tightness and shortness of breath.   Cardiovascular:  Negative for chest pain, palpitations and leg swelling.  Gastrointestinal:  Negative for abdominal pain, diarrhea, nausea and vomiting.  Genitourinary:  Negative for difficulty urinating and dysuria.  Musculoskeletal:  Negative for joint swelling and myalgias.  Skin:  Negative for color change and rash.  Neurological:  Negative for dizziness, light-headedness and headaches.  Psychiatric/Behavioral:  Negative for agitation and dysphoric mood.        Objective:     BP 128/70 (BP Location: Left Arm, Patient Position: Sitting, Cuff Size: Large)   Pulse 70   Temp 98 F (36.7 C) (Temporal)   Resp 17   Ht _0  (1.854 m)   Wt 242 lb 9.6 oz (110 kg)   SpO2 98%   BMI 32.01 kg/m  Wt Readings from Last 3 Encounters:  06/18/22 242 lb 9.6 oz (110 kg)  06/01/22 240 lb (108.9 kg)  04/23/22 246 lb 3.2 oz (111.7 kg)    Physical Exam Constitutional:      General: He is not in acute distress.    Appearance: Normal appearance. He is well-developed.  HENT:     Head: Normocephalic and atraumatic.     Right Ear: External ear normal.     Left Ear: External ear normal.  Eyes:     General: No scleral icterus.        Right eye: No discharge.        Left eye: No discharge.  Cardiovascular:     Rate and Rhythm: Normal rate and regular rhythm.  Pulmonary:     Effort: Pulmonary effort is normal. No respiratory distress.     Breath sounds: Normal breath sounds.  Abdominal:     General: Bowel sounds are normal.     Palpations: Abdomen is soft.     Tenderness: There is no abdominal tenderness.  Musculoskeletal:        General: No swelling or tenderness.     Cervical back: Neck supple. No tenderness.  Lymphadenopathy:     Cervical: No cervical adenopathy.  Skin:    Findings: No erythema or rash.  Neurological:     Mental Status: He is alert.  Psychiatric:        Mood and Affect: Mood normal.        Behavior: Behavior normal.      Outpatient Encounter Medications as of 06/18/2022  Medication Sig   amoxicillin-clavulanate (AUGMENTIN) 875-125 MG tablet Take 1 tablet by mouth 2 (two) times daily.   empagliflozin (JARDIANCE) 25 MG TABS tablet Take 1 tablet (25 mg total) by mouth daily before breakfast.   insulin degludec (TRESIBA FLEXTOUCH) 100 UNIT/ML FlexTouch Pen Inject 10 Units into the skin daily.   loratadine (CLARITIN) 10 MG tablet Take 10 mg by mouth daily.   tiZANidine (ZANAFLEX) 4 MG tablet TAKE 1 TABLET(4 MG) BY MOUTH DAILY IN THE EVENING AS NEEDED FOR MUSCLE SPASMS. DO NOT TAKE AND GO DRIVING OR OPERATE MACHINERY   vitamin B-12 (CYANOCOBALAMIN) 100 MCG tablet Take 100 mcg by mouth daily.   [DISCONTINUED] JARDIANCE 10 MG TABS tablet TAKE 1 TABLET(10 MG) BY MOUTH DAILY BEFORE BREAKFAST   [DISCONTINUED] rosuvastatin (CRESTOR) 40 MG tablet TAKE 1 TABLET(40 MG) BY MOUTH DAILY   [DISCONTINUED] amoxicillin-clavulanate (AUGMENTIN) 875-125 MG tablet Take 1 tablet by mouth 2 (two) times daily. (Patient not taking: Reported on 06/15/2022)   No facility-administered encounter medications on file as of 06/18/2022.     Lab Results  Component Value Date   WBC 6.4 03/27/2022   HGB 14.8 03/27/2022    HCT 42.4 03/27/2022   PLT 150.0 03/27/2022   GLUCOSE 261 (H) 03/27/2022   CHOL 126 03/27/2022   TRIG 375.0 (H) 03/27/2022   HDL 31.30 (L) 03/27/2022   LDLDIRECT 58.0 03/27/2022   LDLCALC 51 12/08/2021   ALT  20 03/27/2022   AST 13 03/27/2022   NA 139 03/27/2022   K 4.5 03/27/2022   CL 104 03/27/2022   CREATININE 1.01 03/27/2022   BUN 19 03/27/2022   CO2 27 03/27/2022   TSH 1.35 12/08/2021   PSA 0.34 12/08/2021   HGBA1C 10.8 Repeated and verified X2. (H) 03/27/2022   MICROALBUR 0.9 04/23/2022       Assessment & Plan:   Problem List Items Addressed This Visit     Acute sinusitis    Recently treated and symptoms have improved, but not completely resolved.  Feels needs extension of abx. Symptoms as outlined.  Extend out augmentin as directed.  Nasal spray.  Follow.  Probiotic as directed.        Relevant Medications   amoxicillin-clavulanate (AUGMENTIN) 875-125 MG tablet   Diabetes mellitus ([place])    Has had intolerance to metformin.  Currently on tresiba.  Tolerates jardiance.  Discussed holding tresiba.  jardiance 64m q day. Low carb diet and exercise.  Follow met b and a1c. Send in sugar readings over the next few weeks.       Relevant Medications   empagliflozin (JARDIANCE) 25 MG TABS tablet   Other Relevant Orders   Basic metabolic panel   Hemoglobin A1c   Hypercholesterolemia    On crestor.  Low cholesterol diet and exercise.  Follow lipid panel and liver function tests.        Relevant Orders   Hepatic function panel   Lipid panel   Right knee pain    PT helping.  Follow.       Stress    Overall feels handling things relatively well. Does not feel needs any further intervention.       [place]    Follow [facility].       Relevant Orders   CBC with Differential/Platelet     CEinar Pheasant MD

## 2022-06-22 ENCOUNTER — Ambulatory Visit: Payer: BC Managed Care – PPO | Admitting: Internal Medicine

## 2022-06-24 ENCOUNTER — Telehealth: Payer: Self-pay | Admitting: Internal Medicine

## 2022-06-24 DIAGNOSIS — E7800 Pure hypercholesterolemia, unspecified: Secondary | ICD-10-CM

## 2022-06-26 ENCOUNTER — Ambulatory Visit: Payer: BC Managed Care – PPO | Attending: Internal Medicine

## 2022-06-26 DIAGNOSIS — G8929 Other chronic pain: Secondary | ICD-10-CM | POA: Insufficient documentation

## 2022-06-26 DIAGNOSIS — M25561 Pain in right knee: Secondary | ICD-10-CM | POA: Insufficient documentation

## 2022-06-28 ENCOUNTER — Encounter: Payer: Self-pay | Admitting: Internal Medicine

## 2022-06-28 NOTE — Assessment & Plan Note (Signed)
Overall feels handling things relatively well. Does not feel needs any further intervention.  

## 2022-06-28 NOTE — Assessment & Plan Note (Signed)
PT helping.  Follow.

## 2022-06-28 NOTE — Assessment & Plan Note (Signed)
Recently treated and symptoms have improved, but not completely resolved.  Feels needs extension of abx. Symptoms as outlined.  Extend out augmentin as directed.  Nasal spray.  Follow.  Probiotic as directed.

## 2022-06-28 NOTE — Assessment & Plan Note (Signed)
Has had intolerance to metformin.  Currently on tresiba.  Tolerates jardiance.  Discussed holding tresiba.  jardiance 71m q day. Low carb diet and exercise.  Follow met b and a1c. Send in sugar readings over the next few weeks.

## 2022-06-28 NOTE — Assessment & Plan Note (Signed)
Follow cbc.  

## 2022-06-28 NOTE — Assessment & Plan Note (Signed)
On crestor.  Low cholesterol diet and exercise.  Follow lipid panel and liver function tests.   

## 2022-06-29 ENCOUNTER — Other Ambulatory Visit: Payer: Self-pay | Admitting: Internal Medicine

## 2022-07-03 ENCOUNTER — Ambulatory Visit: Payer: BC Managed Care – PPO

## 2022-07-03 DIAGNOSIS — G8929 Other chronic pain: Secondary | ICD-10-CM

## 2022-07-06 ENCOUNTER — Encounter: Payer: Self-pay | Admitting: Internal Medicine

## 2022-07-06 NOTE — Progress Notes (Signed)
Pt canceled appt and rescheduled.  Was not seen.

## 2022-07-16 ENCOUNTER — Other Ambulatory Visit (INDEPENDENT_AMBULATORY_CARE_PROVIDER_SITE_OTHER): Payer: BC Managed Care – PPO

## 2022-07-16 DIAGNOSIS — E1165 Type 2 diabetes mellitus with hyperglycemia: Secondary | ICD-10-CM | POA: Diagnosis not present

## 2022-07-16 DIAGNOSIS — E7800 Pure hypercholesterolemia, unspecified: Secondary | ICD-10-CM

## 2022-07-16 DIAGNOSIS — D696 Thrombocytopenia, unspecified: Secondary | ICD-10-CM | POA: Diagnosis not present

## 2022-07-16 LAB — LIPID PANEL
Cholesterol: 179 mg/dL (ref 0–200)
HDL: 38.1 mg/dL — ABNORMAL LOW (ref >39.00)
LDL Cholesterol: 113 mg/dL — ABNORMAL HIGH (ref 0–99)
NonHDL: 140.95
Total CHOL/HDL Ratio: 5
Triglycerides: 139 mg/dL (ref 0.0–149.0)
VLDL: 27.8 mg/dL (ref 0.0–40.0)

## 2022-07-16 LAB — HEPATIC FUNCTION PANEL
ALT: 15 U/L (ref 0–53)
AST: 12 U/L (ref 0–37)
Albumin: 4.4 g/dL (ref 3.5–5.2)
Alkaline Phosphatase: 61 U/L (ref 39–117)
Bilirubin, Direct: 0.1 mg/dL (ref 0.0–0.3)
Total Bilirubin: 0.6 mg/dL (ref 0.2–1.2)
Total Protein: 7.3 g/dL (ref 6.0–8.3)

## 2022-07-16 LAB — CBC WITH DIFFERENTIAL/PLATELET
Basophils Absolute: 0 10*3/uL (ref 0.0–0.1)
Basophils Relative: 0.4 % (ref 0.0–3.0)
Eosinophils Absolute: 0.2 10*3/uL (ref 0.0–0.7)
Eosinophils Relative: 3.3 % (ref 0.0–5.0)
HCT: 43.3 % (ref 39.0–52.0)
Hemoglobin: 14.6 g/dL (ref 13.0–17.0)
Lymphocytes Relative: 27.5 % (ref 12.0–46.0)
Lymphs Abs: 1.4 10*3/uL (ref 0.7–4.0)
MCHC: 33.7 g/dL (ref 30.0–36.0)
MCV: 91.2 fl (ref 78.0–100.0)
Monocytes Absolute: 0.4 10*3/uL (ref 0.1–1.0)
Monocytes Relative: 8.5 % (ref 3.0–12.0)
Neutro Abs: 3.1 10*3/uL (ref 1.4–7.7)
Neutrophils Relative %: 60.3 % (ref 43.0–77.0)
Platelets: 125 10*3/uL — ABNORMAL LOW (ref 150.0–400.0)
RBC: 4.75 Mil/uL (ref 4.22–5.81)
RDW: 13.5 % (ref 11.5–15.5)
WBC: 5.1 10*3/uL (ref 4.0–10.5)

## 2022-07-16 LAB — BASIC METABOLIC PANEL
BUN: 24 mg/dL — ABNORMAL HIGH (ref 6–23)
CO2: 26 mEq/L (ref 19–32)
Calcium: 9.4 mg/dL (ref 8.4–10.5)
Chloride: 103 mEq/L (ref 96–112)
Creatinine, Ser: 1.16 mg/dL (ref 0.40–1.50)
GFR: 72.42 mL/min (ref >60.00)
Glucose, Bld: 147 mg/dL — ABNORMAL HIGH (ref 70–99)
Potassium: 4.3 mEq/L (ref 3.5–5.1)
Sodium: 139 mEq/L (ref 135–145)

## 2022-07-16 LAB — HEMOGLOBIN A1C: Hgb A1c MFr Bld: 7.7 % — ABNORMAL HIGH (ref 4.6–6.5)

## 2022-07-17 ENCOUNTER — Telehealth: Payer: Self-pay

## 2022-07-17 ENCOUNTER — Other Ambulatory Visit: Payer: Self-pay

## 2022-07-17 ENCOUNTER — Other Ambulatory Visit: Payer: Self-pay | Admitting: Internal Medicine

## 2022-07-17 DIAGNOSIS — E7800 Pure hypercholesterolemia, unspecified: Secondary | ICD-10-CM

## 2022-07-17 DIAGNOSIS — D696 Thrombocytopenia, unspecified: Secondary | ICD-10-CM

## 2022-07-17 MED ORDER — EMPAGLIFLOZIN 25 MG PO TABS: 25.0000 mg | ORAL_TABLET | Freq: Every day | ORAL | 3 refills | Status: DC

## 2022-07-17 MED ORDER — ROSUVASTATIN CALCIUM 40 MG PO TABS: ORAL_TABLET | ORAL | 1 refills | Status: DC

## 2022-07-17 NOTE — Telephone Encounter (Signed)
Pt need a refill on Jardiance and rosuvastatin sent to [facility] in [place]

## 2022-07-17 NOTE — Progress Notes (Signed)
Order placed for f/u cbc.   

## 2022-07-17 NOTE — Telephone Encounter (Signed)
LMTCB for lab results.  

## 2022-07-17 NOTE — Telephone Encounter (Signed)
Noted  

## 2022-07-17 NOTE — Telephone Encounter (Signed)
Pt notified that prescriptions sent. 

## 2022-07-17 NOTE — Telephone Encounter (Signed)
Patient returned call . He was given lab results he voiced understanding and he is taking his Crestor. Repeat CBC has been scheduled.

## 2022-07-22 ENCOUNTER — Encounter: Payer: Self-pay | Admitting: Internal Medicine

## 2022-07-22 ENCOUNTER — Ambulatory Visit (INDEPENDENT_AMBULATORY_CARE_PROVIDER_SITE_OTHER): Payer: BC Managed Care – PPO | Admitting: Internal Medicine

## 2022-07-22 VITALS — BP 122/68 | HR 77 | Temp 98.3°F | Ht 73.0 in | Wt 242.0 lb

## 2022-07-22 DIAGNOSIS — G4719 Other hypersomnia: Secondary | ICD-10-CM | POA: Diagnosis not present

## 2022-07-22 DIAGNOSIS — F17210 Nicotine dependence, cigarettes, uncomplicated: Secondary | ICD-10-CM | POA: Diagnosis not present

## 2022-07-22 NOTE — Patient Instructions (Addendum)
Obtain home sleep study to assess for sleep apnea   Smoking cessation advised   Referral to Lung cancer screening program

## 2022-07-22 NOTE — Progress Notes (Signed)
Name: Edward Spencer MRN: [id number] DOB: August 29, 1971     CONSULTATION DATE: 07/22/2022    CHIEF COMPLAINT: Excessive daytime sleepiness   HISTORY OF PRESENT ILLNESS:  Patient is seen today for problems and issues with sleep related to excessive daytime sleepiness Patient  has been having sleep problems for many years Patient has been having excessive daytime sleepiness for a long time Patient has been having extreme fatigue and tiredness, lack of energy  Discussed sleep data and reviewed with patient.  Encouraged proper weight management.  Discussed driving precautions and its relationship with hypersomnolence.  Discussed operating dangerous equipment and its relationship with hypersomnolence.  Discussed sleep hygiene, and benefits of a fixed sleep waked time.  The importance of getting eight or more hours of sleep discussed with patient.  Discussed limiting the use of the computer and television before bedtime.  Decrease naps during the day, so night time sleep will become enhanced.  Limit caffeine, and sleep deprivation.  HTN, stroke, and heart failure are potential risk factors.    EPWORTH SLEEP SCORE 14  Smoking Assessment and Cessation Counseling Upon further questioning, Patient smokes 1/2 PPD I have advised patient to quit/stop smoking as soon as possible due to high risk for multiple medical problems  Patient is NOT willing to quit smoking  I have advised patient that we can assist and have options of Nicotine replacement therapy. I also advised patient on behavioral therapy and can provide oral medication therapy in conjunction with the other therapies Follow up next Office visit  for assessment of smoking cessation Smoking cessation counseling advised for 4 minutes   PAST MEDICAL HISTORY :   has a past medical history of Allergy, Arthritis, Asthma, Depression, Diabetes mellitus without complication (HCC), and History of chicken pox.  has no past surgical history  on file. Prior to Admission medications   Medication Sig Start Date End Date Taking? Authorizing Provider  empagliflozin (JARDIANCE) 25 MG TABS tablet Take 1 tablet (25 mg total) by mouth daily before breakfast. 07/17/22  Yes Dale Wyocena, MD  insulin degludec (TRESIBA FLEXTOUCH) 100 UNIT/ML FlexTouch Pen Inject 10 Units into the skin daily. 04/26/22  Yes Dale Robinson, MD  loratadine (CLARITIN) 10 MG tablet Take 10 mg by mouth daily.   Yes [provider]  rosuvastatin (CRESTOR) 40 MG tablet TAKE 1 TABLET(40 MG) BY MOUTH DAILY 07/17/22  Yes Dale Irwin, MD  tiZANidine (ZANAFLEX) 4 MG tablet TAKE 1 TABLET(4 MG) BY MOUTH DAILY IN THE EVENING AS NEEDED FOR MUSCLE SPASMS. DO NOT TAKE AND GO DRIVING OR OPERATE MACHINERY 06/29/22  Yes Dale Rossmoyne, MD  vitamin B-12 (CYANOCOBALAMIN) 100 MCG tablet Take 100 mcg by mouth daily.   Yes [provider]   No Known Allergies  FAMILY HISTORY:  family history includes Alcohol abuse in his maternal aunt and paternal aunt; Arthritis in his father, maternal grandmother, mother, paternal grandmother, and paternal uncle; Diabetes in his father and paternal grandmother; Hyperlipidemia in his maternal aunt, mother, and paternal grandmother; Hypertension in his father; Mental illness in his maternal aunt and paternal aunt. SOCIAL HISTORY:  reports that he has been smoking cigarettes. He has a 10.00 pack-year smoking history. He has never used smokeless tobacco. He reports current alcohol use. He reports that he does not use drugs.   Review of Systems:  Gen:  Denies  fever, sweats, chills weight loss  HEENT: Denies blurred vision, double vision, ear pain, eye pain, hearing loss, nose bleeds, sore throat Cardiac:  No  dizziness, chest pain or heaviness, chest tightness,edema, No JVD Resp:   No cough, -sputum production, -shortness of breath,-wheezing, -hemoptysis,  Gi: Denies swallowing difficulty, stomach pain, nausea or vomiting, diarrhea,  constipation, bowel incontinence Gu:  Denies bladder incontinence, burning urine Ext:   Denies Joint pain, stiffness or swelling Skin: Denies  skin rash, easy bruising or bleeding or hives Endoc:  Denies polyuria, polydipsia , polyphagia or weight change Psych:   Denies depression, insomnia or hallucinations  Other:  All other systems negative   ALL OTHER ROS ARE NEGATIVE  BP 122/68 (BP Location: Left Arm, Cuff Size: Normal)   Pulse 77   Temp 98.3 F (36.8 C) (Temporal)   Ht 6\' 1"  (1.854 m)   Wt 242 lb (109.8 kg)   SpO2 97%   BMI 31.93 kg/m     Physical Examination:   General Appearance: No distress  EYES PERRLA, EOM intact.   NECK Supple, No JVD Pulmonary: normal breath sounds, No wheezing.  CardiovascularNormal S1,S2.  No m/r/g.   Abdomen: Benign, Soft, non-tender. Skin:   warm, no rashes, no ecchymosis  Extremities: normal, no cyanosis, clubbing. Neuro:without focal findings,  speech normal  PSYCHIATRIC: Mood, affect within normal limits.   ALL OTHER ROS ARE NEGATIVE      ASSESSMENT AND PLAN SYNOPSIS OBTAIN HOME SLEEP STUDY TO ASSESS FOR OSA  Obesity -recommend significant weight loss -recommend changing diet  Deconditioned state -Recommend increased daily activity and exercise  Smoking cessation advised   Referral to Lung cancer screening program   Patient  satisfied with Plan of action and management. All questions answered  Follow up  6 months  Total Time Spent  35 mins   Wallis Bamberg, M.D.  [facility]  Medical Director [facility] Medical Director [facility] Cardio-Pulmonary Department

## 2022-08-03 ENCOUNTER — Other Ambulatory Visit: Payer: Self-pay | Admitting: Internal Medicine

## 2022-08-07 ENCOUNTER — Other Ambulatory Visit (INDEPENDENT_AMBULATORY_CARE_PROVIDER_SITE_OTHER): Payer: BC Managed Care – PPO

## 2022-08-07 DIAGNOSIS — D696 Thrombocytopenia, unspecified: Secondary | ICD-10-CM

## 2022-08-07 LAB — CBC WITH DIFFERENTIAL/PLATELET
Basophils Absolute: 0 10*3/uL (ref 0.0–0.1)
Basophils Relative: 0.6 % (ref 0.0–3.0)
Eosinophils Absolute: 0.2 10*3/uL (ref 0.0–0.7)
Eosinophils Relative: 3.4 % (ref 0.0–5.0)
HCT: 43.2 % (ref 39.0–52.0)
Hemoglobin: 14.8 g/dL (ref 13.0–17.0)
Lymphocytes Relative: 34.8 % (ref 12.0–46.0)
Lymphs Abs: 1.8 10*3/uL (ref 0.7–4.0)
MCHC: 34.2 g/dL (ref 30.0–36.0)
MCV: 89.9 fl (ref 78.0–100.0)
Monocytes Absolute: 0.6 10*3/uL (ref 0.1–1.0)
Monocytes Relative: 10.4 % (ref 3.0–12.0)
Neutro Abs: 2.7 10*3/uL (ref 1.4–7.7)
Neutrophils Relative %: 50.8 % (ref 43.0–77.0)
Platelets: 135 10*3/uL — ABNORMAL LOW (ref 150.0–400.0)
RBC: 4.81 Mil/uL (ref 4.22–5.81)
RDW: 13.6 % (ref 11.5–15.5)
WBC: 5.3 10*3/uL (ref 4.0–10.5)

## 2022-09-05 ENCOUNTER — Other Ambulatory Visit: Payer: Self-pay | Admitting: Internal Medicine

## 2022-09-21 ENCOUNTER — Encounter: Payer: Self-pay | Admitting: Internal Medicine

## 2022-09-21 ENCOUNTER — Ambulatory Visit (INDEPENDENT_AMBULATORY_CARE_PROVIDER_SITE_OTHER): Payer: BC Managed Care – PPO | Admitting: Internal Medicine

## 2022-09-21 VITALS — BP 126/80 | HR 86 | Temp 98.0°F | Resp 17 | Ht 72.0 in | Wt 246.8 lb

## 2022-09-21 DIAGNOSIS — E7800 Pure hypercholesterolemia, unspecified: Secondary | ICD-10-CM

## 2022-09-21 DIAGNOSIS — F439 Reaction to severe stress, unspecified: Secondary | ICD-10-CM | POA: Diagnosis not present

## 2022-09-21 DIAGNOSIS — Z0001 Encounter for general adult medical examination with abnormal findings: Secondary | ICD-10-CM

## 2022-09-21 DIAGNOSIS — Z0000 Encounter for general adult medical examination without abnormal findings: Secondary | ICD-10-CM

## 2022-09-21 DIAGNOSIS — E1165 Type 2 diabetes mellitus with hyperglycemia: Secondary | ICD-10-CM | POA: Diagnosis not present

## 2022-09-21 DIAGNOSIS — M545 Low back pain, unspecified: Secondary | ICD-10-CM | POA: Diagnosis not present

## 2022-09-21 DIAGNOSIS — D696 Thrombocytopenia, unspecified: Secondary | ICD-10-CM

## 2022-09-21 MED ORDER — EMPAGLIFLOZIN 25 MG PO TABS: 25.0000 mg | ORAL_TABLET | Freq: Every day | ORAL | 1 refills | Status: DC

## 2022-09-21 MED ORDER — ROSUVASTATIN CALCIUM 40 MG PO TABS: ORAL_TABLET | ORAL | 1 refills | Status: DC

## 2022-09-21 NOTE — Progress Notes (Signed)
Patient ID: Edward Spencer, male   DOB: Apr 09, 1971, 51 y.o.   MRN: [id number]   Subjective:    Patient ID: Edward Spencer, male    DOB: 08-24-1971, 51 y.o.   MRN: [id number]   Patient here for  Chief Complaint  Patient presents with   Annual Exam    CPE   .   HPI Here for his physical exam. Had intolerance to metformin.  Tolerates jardiance.  Watching diet.  Decreased carb intake. Reports sugars are better - doing ok.  Decreased stress.  Planning to start a new job.  No chest pain or sob reported.  No cough or congestion.  No abdominal pain.  Bowels moving.  PT for right knee pain. Has noticed some back pain.  No known injury or trauma.  No radicular symptoms.  Was questioning kidneys.  No urinary symptoms.    Past Medical History:  Diagnosis Date   Allergy    Arthritis    Asthma    Depression    Diabetes mellitus without complication ([place])    History of chicken pox    History reviewed. No pertinent surgical history. Family History  Problem Relation Age of Onset   Arthritis Mother    Hyperlipidemia Mother    Arthritis Father    Hypertension Father    Diabetes Father    Mental illness Maternal Aunt    Hyperlipidemia Maternal Aunt    Alcohol abuse Maternal Aunt    Mental illness Paternal Aunt    Alcohol abuse Paternal Aunt    Arthritis Paternal Uncle    Arthritis Maternal Grandmother    Arthritis Paternal Grandmother    Hyperlipidemia Paternal Grandmother    Diabetes Paternal Grandmother    Social History   Socioeconomic History   Marital status: Single    Spouse name: Not on file   Number of children: Not on file   Years of education: Not on file   Highest education level: Not on file  Occupational History   Not on file  Tobacco Use   Smoking status: Every Day    Packs/day: 0.25    Years: 40.00    Total pack years: 10.00    Types: Cigarettes   Smokeless tobacco: Never   Tobacco comments:    1.5 pack per week- 07/22/2022  Vaping Use   Vaping Use: Never used   Substance and Sexual Activity   Alcohol use: Yes    Alcohol/week: 0.0 standard drinks of alcohol    Comment: social   Drug use: No   Sexual activity: Not on file  Other Topics Concern   Not on file  Social History Narrative   Not on file   Social Determinants of Health   Financial Resource Strain: Low Risk  (09/26/2021)   Overall Financial Resource Strain (CARDIA)    Difficulty of Paying Living Expenses: Not hard at all  Food Insecurity: Not on file  Transportation Needs: Not on file  Physical Activity: Not on file  Stress: Not on file  Social Connections: Not on file     Review of Systems  Constitutional:  Negative for appetite change and unexpected weight change.  HENT:  Negative for congestion, sinus pressure and sore throat.   Eyes:  Negative for pain and visual disturbance.  Respiratory:  Negative for cough, chest tightness and shortness of breath.   Cardiovascular:  Negative for chest pain, palpitations and leg swelling.  Gastrointestinal:  Negative for abdominal pain, diarrhea, nausea and vomiting.  Genitourinary:  Negative  for difficulty urinating and dysuria.  Musculoskeletal:  Positive for back pain. Negative for joint swelling.  Skin:  Negative for color change and rash.  Neurological:  Negative for dizziness and headaches.  Hematological:  Negative for adenopathy. Does not bruise/bleed easily.  Psychiatric/Behavioral:  Negative for agitation and dysphoric mood.        Objective:     BP 126/80 (BP Location: Left Arm, Patient Position: Sitting, Cuff Size: Large)   Pulse 86   Temp 98 F (36.7 C) (Temporal)   Resp 17   Ht 6' (1.829 m)   Wt 246 lb 12.8 oz (111.9 kg)   SpO2 98%   BMI 33.47 kg/m  Wt Readings from Last 3 Encounters:  09/21/22 246 lb 12.8 oz (111.9 kg)  07/22/22 242 lb (109.8 kg)  06/18/22 242 lb 9.6 oz (110 kg)    Physical Exam Constitutional:      General: He is not in acute distress.    Appearance: Normal appearance. He is  well-developed.  HENT:     Head: Normocephalic and atraumatic.     Right Ear: External ear normal.     Left Ear: External ear normal.  Eyes:     General: No scleral icterus.       Right eye: No discharge.        Left eye: No discharge.     Conjunctiva/sclera: Conjunctivae normal.  Neck:     Thyroid: No thyromegaly.  Cardiovascular:     Rate and Rhythm: Normal rate and regular rhythm.  Pulmonary:     Effort: No respiratory distress.     Breath sounds: Normal breath sounds. No wheezing.  Abdominal:     General: Bowel sounds are normal.     Palpations: Abdomen is soft.     Tenderness: There is no abdominal tenderness.  Musculoskeletal:        General: No swelling or tenderness.     Cervical back: Neck supple. No tenderness.  Lymphadenopathy:     Cervical: No cervical adenopathy.  Skin:    Findings: No erythema or rash.  Neurological:     Mental Status: He is alert and oriented to person, place, and time.  Psychiatric:        Mood and Affect: Mood normal.        Behavior: Behavior normal.      Outpatient Encounter Medications as of 09/21/2022  Medication Sig   loratadine (CLARITIN) 10 MG tablet Take 10 mg by mouth daily.   tiZANidine (ZANAFLEX) 4 MG tablet TAKE 1 TABLET(4 MG) BY MOUTH DAILY IN THE EVENING AS NEEDED FOR MUSCLE SPASMS. DO NOT TAKE AND GO DRIVING OR OPERATE MACHINERY   TRESIBA FLEXTOUCH 100 UNIT/ML FlexTouch Pen ADMINISTER 10 UNITS UNDER THE SKIN DAILY (Patient taking differently: 7 Units.)   vitamin B-12 (CYANOCOBALAMIN) 100 MCG tablet Take 100 mcg by mouth daily.   [DISCONTINUED] empagliflozin (JARDIANCE) 25 MG TABS tablet Take 1 tablet (25 mg total) by mouth daily before breakfast.   [DISCONTINUED] rosuvastatin (CRESTOR) 40 MG tablet TAKE 1 TABLET(40 MG) BY MOUTH DAILY   empagliflozin (JARDIANCE) 25 MG TABS tablet Take 1 tablet (25 mg total) by mouth daily before breakfast.   rosuvastatin (CRESTOR) 40 MG tablet TAKE 1 TABLET(40 MG) BY MOUTH DAILY   No  facility-administered encounter medications on file as of 09/21/2022.     Lab Results  Component Value Date   WBC 5.3 08/07/2022   HGB 14.8 08/07/2022   HCT 43.2 08/07/2022   PLT 135.0 (L)  08/07/2022   GLUCOSE 116 (H) 09/24/2022   CHOL 179 07/16/2022   TRIG 139.0 07/16/2022   HDL 38.10 (L) 07/16/2022   LDLDIRECT 58.0 03/27/2022   LDLCALC 113 (H) 07/16/2022   ALT 15 07/16/2022   AST 12 07/16/2022   NA 139 09/24/2022   K 4.5 09/24/2022   CL 102 09/24/2022   CREATININE 0.92 09/24/2022   BUN 17 09/24/2022   CO2 28 09/24/2022   TSH 1.35 12/08/2021   PSA 0.34 12/08/2021   HGBA1C 7.7 (H) 07/16/2022   MICROALBUR 0.9 04/23/2022       Assessment & Plan:   Problem List Items Addressed This Visit     Back pain    Appears to be more msk in origin.  No known injury or trauma.  Check urine and met b.  Follow. Call with update.       Relevant Orders   Basic metabolic panel (Completed)   Urinalysis, Routine w reflex microscopic (Completed)   Diabetes mellitus ([place])    Has had intolerance to metformin.  Currently on tresiba.  Tolerates jardiance 74m q day. Low carb diet and exercise.  Follow met b and a1c. Send in sugar readings over the next few weeks.       Relevant Medications   empagliflozin (JARDIANCE) 25 MG TABS tablet   rosuvastatin (CRESTOR) 40 MG tablet   Other Relevant Orders   Hemoglobin AN2T  Basic metabolic panel   Health care maintenance    Physical today 09/21/22. .Marland Kitchen Discussed colonoscopy.  Will notify me when agreeable for colonoscopy.  PSA 12/08/21 - .34.       Hypercholesterolemia    On crestor.  Low cholesterol diet and exercise.  Follow lipid panel and liver function tests.        Relevant Medications   rosuvastatin (CRESTOR) 40 MG tablet   Other Relevant Orders   Hepatic function panel   Lipid panel   TSH   Stress - Primary    Overall feels handling things relatively well. Does not feel needs any further intervention.       Thrombocytopenia ([place]     Follow cbc.       Relevant Orders   CBC with Differential/Platelet     CEinar Pheasant MD

## 2022-09-21 NOTE — Assessment & Plan Note (Signed)
Physical today 09/21/22. Marland Kitchen  Discussed colonoscopy.  Will notify me when agreeable for colonoscopy.  PSA 12/08/21 - .34.

## 2022-09-24 ENCOUNTER — Other Ambulatory Visit (INDEPENDENT_AMBULATORY_CARE_PROVIDER_SITE_OTHER): Payer: BC Managed Care – PPO

## 2022-09-24 DIAGNOSIS — M545 Low back pain, unspecified: Secondary | ICD-10-CM

## 2022-09-24 LAB — URINALYSIS, ROUTINE W REFLEX MICROSCOPIC
Bilirubin Urine: NEGATIVE
Hgb urine dipstick: NEGATIVE
Ketones, ur: 40 — AB
Leukocytes,Ua: NEGATIVE
Nitrite: NEGATIVE
RBC / HPF: NONE SEEN (ref 0–?)
Specific Gravity, Urine: 1.025 (ref 1.000–1.030)
Total Protein, Urine: NEGATIVE
Urine Glucose: >=1000 — AB
Urobilinogen, UA: 0.2 (ref 0.0–1.0)
WBC, UA: NONE SEEN (ref 0–?)
pH: 5.5 (ref 5.0–8.0)

## 2022-09-24 LAB — BASIC METABOLIC PANEL
BUN: 17 mg/dL (ref 6–23)
CO2: 28 mEq/L (ref 19–32)
Calcium: 9.9 mg/dL (ref 8.4–10.5)
Chloride: 102 mEq/L (ref 96–112)
Creatinine, Ser: 0.92 mg/dL (ref 0.40–1.50)
GFR: 95.52 mL/min (ref >60.00)
Glucose, Bld: 116 mg/dL — ABNORMAL HIGH (ref 70–99)
Potassium: 4.5 mEq/L (ref 3.5–5.1)
Sodium: 139 mEq/L (ref 135–145)

## 2022-09-25 ENCOUNTER — Telehealth: Payer: Self-pay

## 2022-09-25 NOTE — Telephone Encounter (Signed)
Mychart sent for results

## 2022-09-27 ENCOUNTER — Encounter: Payer: Self-pay | Admitting: Internal Medicine

## 2022-09-27 NOTE — Assessment & Plan Note (Signed)
Overall feels handling things relatively well. Does not feel needs any further intervention.

## 2022-09-27 NOTE — Assessment & Plan Note (Signed)
Has had intolerance to metformin.  Currently on tresiba.  Tolerates jardiance 68m q day. Low carb diet and exercise.  Follow met b and a1c. Send in sugar readings over the next few weeks.

## 2022-09-27 NOTE — Assessment & Plan Note (Signed)
On crestor.  Low cholesterol diet and exercise.  Follow lipid panel and liver function tests.   

## 2022-09-27 NOTE — Assessment & Plan Note (Signed)
Appears to be more msk in origin.  No known injury or trauma.  Check urine and met b.  Follow. Call with update.

## 2022-09-27 NOTE — Assessment & Plan Note (Signed)
Follow cbc.  

## 2022-11-20 ENCOUNTER — Other Ambulatory Visit (INDEPENDENT_AMBULATORY_CARE_PROVIDER_SITE_OTHER): Payer: 59

## 2022-11-20 DIAGNOSIS — D696 Thrombocytopenia, unspecified: Secondary | ICD-10-CM

## 2022-11-20 DIAGNOSIS — E1165 Type 2 diabetes mellitus with hyperglycemia: Secondary | ICD-10-CM | POA: Diagnosis not present

## 2022-11-20 DIAGNOSIS — E7800 Pure hypercholesterolemia, unspecified: Secondary | ICD-10-CM

## 2022-11-20 LAB — HEPATIC FUNCTION PANEL
ALT: 18 U/L (ref 0–53)
AST: 16 U/L (ref 0–37)
Albumin: 4.5 g/dL (ref 3.5–5.2)
Alkaline Phosphatase: 69 U/L (ref 39–117)
Bilirubin, Direct: 0.1 mg/dL (ref 0.0–0.3)
Total Bilirubin: 0.5 mg/dL (ref 0.2–1.2)
Total Protein: 7.7 g/dL (ref 6.0–8.3)

## 2022-11-20 LAB — HEMOGLOBIN A1C: Hgb A1c MFr Bld: 7.9 % — ABNORMAL HIGH (ref 4.6–6.5)

## 2022-11-20 LAB — BASIC METABOLIC PANEL
BUN: 22 mg/dL (ref 6–23)
CO2: 29 mEq/L (ref 19–32)
Calcium: 9.8 mg/dL (ref 8.4–10.5)
Chloride: 104 mEq/L (ref 96–112)
Creatinine, Ser: 1 mg/dL (ref 0.40–1.50)
GFR: 86.33 mL/min (ref >60.00)
Glucose, Bld: 171 mg/dL — ABNORMAL HIGH (ref 70–99)
Potassium: 4.5 mEq/L (ref 3.5–5.1)
Sodium: 141 mEq/L (ref 135–145)

## 2022-11-20 LAB — CBC WITH DIFFERENTIAL/PLATELET
Basophils Absolute: 0 10*3/uL (ref 0.0–0.1)
Basophils Relative: 0.4 % (ref 0.0–3.0)
Eosinophils Absolute: 0.2 10*3/uL (ref 0.0–0.7)
Eosinophils Relative: 2.8 % (ref 0.0–5.0)
HCT: 42.4 % (ref 39.0–52.0)
Hemoglobin: 14.5 g/dL (ref 13.0–17.0)
Lymphocytes Relative: 28.8 % (ref 12.0–46.0)
Lymphs Abs: 1.8 10*3/uL (ref 0.7–4.0)
MCHC: 34.3 g/dL (ref 30.0–36.0)
MCV: 90.7 fl (ref 78.0–100.0)
Monocytes Absolute: 0.6 10*3/uL (ref 0.1–1.0)
Monocytes Relative: 9.6 % (ref 3.0–12.0)
Neutro Abs: 3.6 10*3/uL (ref 1.4–7.7)
Neutrophils Relative %: 58.4 % (ref 43.0–77.0)
Platelets: 198 10*3/uL (ref 150.0–400.0)
RBC: 4.67 Mil/uL (ref 4.22–5.81)
RDW: 12.6 % (ref 11.5–15.5)
WBC: 6.2 10*3/uL (ref 4.0–10.5)

## 2022-11-20 LAB — LIPID PANEL
Cholesterol: 135 mg/dL (ref 0–200)
HDL: 34.6 mg/dL — ABNORMAL LOW (ref >39.00)
LDL Cholesterol: 78 mg/dL (ref 0–99)
NonHDL: 100.45
Total CHOL/HDL Ratio: 4
Triglycerides: 114 mg/dL (ref 0.0–149.0)
VLDL: 22.8 mg/dL (ref 0.0–40.0)

## 2022-11-20 LAB — TSH: TSH: 1.52 u[IU]/mL (ref 0.35–5.50)

## 2022-11-23 ENCOUNTER — Other Ambulatory Visit: Payer: Self-pay

## 2022-11-23 DIAGNOSIS — E1165 Type 2 diabetes mellitus with hyperglycemia: Secondary | ICD-10-CM

## 2022-11-24 ENCOUNTER — Telehealth: Payer: Self-pay | Admitting: Pharmacist

## 2022-11-24 NOTE — Progress Notes (Unsigned)
Contacted patient via [facility] regarding referral for diabetes from Einar Pheasant, [place], PharmD, [facility] [phone]

## 2022-12-10 ENCOUNTER — Other Ambulatory Visit: Payer: Self-pay

## 2022-12-10 ENCOUNTER — Other Ambulatory Visit: Payer: 59 | Admitting: Pharmacist

## 2022-12-10 MED ORDER — EMPAGLIFLOZIN 25 MG PO TABS: 25.0000 mg | ORAL_TABLET | Freq: Every day | ORAL | 1 refills | Status: DC

## 2022-12-10 MED ORDER — TRESIBA FLEXTOUCH 100 UNIT/ML ~~LOC~~ SOPN: 10.0000 [IU] | PEN_INJECTOR | Freq: Every day | SUBCUTANEOUS | 1 refills | Status: DC

## 2022-12-10 NOTE — Progress Notes (Signed)
12/10/2022 Name: Edward Spencer MRN: [id number] DOB: August 31, 1971  Chief Complaint  Patient presents with   Medication Management   Diabetes    Edward Spencer is a 52 y.o. year old male who presented for a telephone visit.   They were referred to the pharmacist by their PCP for assistance in managing diabetes.   Subjective:  Care Team: Primary Care Provider: Einar Pheasant, MD ; Next Scheduled Visit: 12/25/22  Medication Access/Adherence  Current Pharmacy:  [facility] [place], [place] [facility] [address] AT [place] [facility] [place] [address] Phone: [phone] Fax: [place] [address] [place] [address] Phone: [phone] Fax: [phone]   Patient reports affordability concerns with their medications: No  Patient reports access/transportation concerns to their pharmacy: No  Patient reports adherence concerns with their medications:  No    Reports he changed insurances; getting ready to change again. Denies affordability concerns at this tmie  Diabetes:  Current medications: Jardiance 25 mg daily, Tresiba 7-8 units Medications tried in the past: intolerance to metformin  Current glucose readings: fasting: 150-170s; before supper 130-150  Patient denies hypoglycemic s/sx including dizziness, shakiness, sweating. Patient denies hyperglycemic symptoms including polyuria, polydipsia, polyphagia, nocturia, neuropathy, blurred vision.  Current meal patterns: wakes up at 3 am, drives 30 minutes - Breakfast: coffee, occasionally egg white whole grain croissant microwave breakfast (keto) - Lunch: packs something from home; keto bread sandwich or soup + salad; leftover - Supper: zucchini lasagna; salad; mixed vegetables;  - Snacks: [place] beef sticks - Drinks: water; low carb beer  Current physical activity: 13-16K steps daily   Hyperlipidemia/ASCVD Risk  Reduction  Current lipid lowering medications: rosuvastatin 40 mg daily   Objective:  Lab Results  Component Value Date   HGBA1C 7.9 (H) 11/20/2022    Lab Results  Component Value Date   CREATININE 1.00 11/20/2022   BUN 22 11/20/2022   NA 141 11/20/2022   K 4.5 11/20/2022   CL 104 11/20/2022   CO2 29 11/20/2022    Lab Results  Component Value Date   CHOL 135 11/20/2022   HDL 34.60 (L) 11/20/2022   LDLCALC 78 11/20/2022   LDLDIRECT 58.0 03/27/2022   TRIG 114.0 11/20/2022   CHOLHDL 4 11/20/2022    Medications Reviewed Today     Reviewed by Edward Spencer, RPH-CPP (Pharmacist) on 12/10/22 at [place] List Status: <None>   Medication Order Taking? Sig Documenting Provider Last Dose Status Informant  empagliflozin (JARDIANCE) 25 MG TABS tablet [id number] Yes Take 1 tablet (25 mg total) by mouth daily before breakfast. Edward Pheasant, MD Taking Active   loratadine (CLARITIN) 10 MG tablet [id number] Yes Take 10 mg by mouth daily. [provider] Taking Active Self  rosuvastatin (CRESTOR) 40 MG tablet [id number] Yes TAKE 1 TABLET(40 MG) BY MOUTH DAILY Edward Pheasant, MD Taking Active   TRESIBA FLEXTOUCH 100 UNIT/ML FlexTouch Pen [id number] Yes ADMINISTER 10 UNITS UNDER THE SKIN DAILY  Patient taking differently: 7 Units.   Edward Pheasant, MD Taking Active   vitamin B-12 (CYANOCOBALAMIN) 100 MCG tablet [id number] Yes Take 100 mcg by mouth daily. [provider] Taking Active               Assessment/Plan:   Diabetes: - Currently uncontrolled - Reviewed long term cardiovascular and renal outcomes of uncontrolled blood sugar - Reviewed goal A1c, goal fasting, and goal  2 hour post prandial glucose - Reviewed dietary modifications including: praised for low carb focus - Reviewed lifestyle modifications including: encouraged continued active lifestyle - Recommend to consider CGM. Patient declines. Discussed trial of GLP1 vs increasing basal insulin  dose; patient elects to increase basal insulin dose at this time, but will consider GLP1 and we will discuss next month - Recommend to check glucose twice daily, fasting and 2 hour post prandial  Hyperlipidemia/ASCVD Risk Reduction: - Currently uncontrolled.  - Keto focus likely increasing fat consumption, impacting lipids - Recommend to continue current regimen at this time   Follow Up Plan: phone call in 5 weeks  Edward Spencer, PharmD, [place], [facility] [phone]

## 2022-12-10 NOTE — Patient Instructions (Addendum)
Dejay,   Increase Tyler Aas to 10 units daily. Continue Jardiance 25 mg daily.   Think about the Libre 3 Continuous Glucose Monitor. It can be an incredibly helpful tool: https://www.freestyle.abbott/us-en/products/freestyle-libre-3.html  Check your blood sugars twice daily:  1) Fasting, first thing in the morning before breakfast and  2) 2 hours after your largest meal.   For a goal A1c of less than 7%, goal fasting readings are less than 130 and goal 2 hour after meal readings are less than 180.    Take care!  Catie Hedwig Morton, PharmD, [place], [facility] [phone]

## 2022-12-11 LAB — HM DIABETES EYE EXAM

## 2022-12-14 ENCOUNTER — Other Ambulatory Visit: Payer: Self-pay

## 2022-12-25 ENCOUNTER — Encounter: Payer: Self-pay | Admitting: Internal Medicine

## 2022-12-25 ENCOUNTER — Ambulatory Visit (INDEPENDENT_AMBULATORY_CARE_PROVIDER_SITE_OTHER): Payer: 59 | Admitting: Internal Medicine

## 2022-12-25 VITALS — BP 128/70 | HR 78 | Temp 97.9°F | Resp 16 | Ht 72.0 in | Wt 249.0 lb

## 2022-12-25 DIAGNOSIS — F439 Reaction to severe stress, unspecified: Secondary | ICD-10-CM

## 2022-12-25 DIAGNOSIS — D696 Thrombocytopenia, unspecified: Secondary | ICD-10-CM

## 2022-12-25 DIAGNOSIS — Z1211 Encounter for screening for malignant neoplasm of colon: Secondary | ICD-10-CM | POA: Diagnosis not present

## 2022-12-25 DIAGNOSIS — E7800 Pure hypercholesterolemia, unspecified: Secondary | ICD-10-CM | POA: Diagnosis not present

## 2022-12-25 DIAGNOSIS — E1165 Type 2 diabetes mellitus with hyperglycemia: Secondary | ICD-10-CM | POA: Diagnosis not present

## 2022-12-25 DIAGNOSIS — R143 Flatulence: Secondary | ICD-10-CM

## 2022-12-25 LAB — HM DIABETES FOOT EXAM

## 2022-12-25 NOTE — Progress Notes (Unsigned)
Subjective:    Patient ID: Edward Spencer, male    DOB: 10/31/1971, 52 y.o.   MRN: [id number]  Patient here for  Chief Complaint  Patient presents with   Medical Management of Chronic Issues    HPI Here to follow up regarding his diabetes.  On jardiance and tresiba.  Dose just increased. He adjusts his insulin prn - if sugars elevated.  In the process of seeing if can get insurance coverage for ozempic.  He stays active.  Walkes 8-12,000 steps at work.  Changing jobs.  No chest pain or sob reported.  No abdominal pain.  Some increased gas.  Bowels moving.  Handling stress.   Changed jobs.    Past Medical History:  Diagnosis Date   Allergy    Arthritis    Asthma    Depression    Diabetes mellitus without complication ([place])    History of chicken pox    History reviewed. No pertinent surgical history. Family History  Problem Relation Age of Onset   Arthritis Mother    Hyperlipidemia Mother    Arthritis Father    Hypertension Father    Diabetes Father    Mental illness Maternal Aunt    Hyperlipidemia Maternal Aunt    Alcohol abuse Maternal Aunt    Mental illness Paternal Aunt    Alcohol abuse Paternal Aunt    Arthritis Paternal Uncle    Arthritis Maternal Grandmother    Arthritis Paternal Grandmother    Hyperlipidemia Paternal Grandmother    Diabetes Paternal Grandmother    Social History   Socioeconomic History   Marital status: Single    Spouse name: Not on file   Number of children: Not on file   Years of education: Not on file   Highest education level: Not on file  Occupational History   Not on file  Tobacco Use   Smoking status: Every Day    Packs/day: 0.25    Years: 40.00    Total pack years: 10.00    Types: Cigarettes   Smokeless tobacco: Never   Tobacco comments:    1.5 pack per week- 07/22/2022  Vaping Use   Vaping Use: Never used  Substance and Sexual Activity   Alcohol use: Yes    Alcohol/week: 0.0 standard drinks of alcohol    Comment:  social   Drug use: No   Sexual activity: Not on file  Other Topics Concern   Not on file  Social History Narrative   Not on file   Social Determinants of Health   Financial Resource Strain: Low Risk  (09/26/2021)   Overall Financial Resource Strain (CARDIA)    Difficulty of Paying Living Expenses: Not hard at all  Food Insecurity: Not on file  Transportation Needs: Not on file  Physical Activity: Not on file  Stress: Not on file  Social Connections: Not on file     Review of Systems  Constitutional:  Negative for appetite change and unexpected weight change.  HENT:  Negative for congestion and sinus pressure.   Respiratory:  Negative for cough, chest tightness and shortness of breath.   Cardiovascular:  Negative for chest pain, palpitations and leg swelling.  Gastrointestinal:  Negative for abdominal pain, diarrhea, nausea and vomiting.  Genitourinary:  Negative for difficulty urinating and dysuria.  Musculoskeletal:  Negative for joint swelling and myalgias.  Skin:  Negative for color change and rash.  Neurological:  Negative for dizziness and headaches.  Psychiatric/Behavioral:  Negative for agitation and dysphoric mood.  Objective:     BP 128/70   Pulse 78   Temp 97.9 F (36.6 C)   Resp 16   Ht 6' (1.829 m)   Wt 249 lb (112.9 kg)   SpO2 98%   BMI 33.77 kg/m  Wt Readings from Last 3 Encounters:  12/25/22 249 lb (112.9 kg)  09/21/22 246 lb 12.8 oz (111.9 kg)  07/22/22 242 lb (109.8 kg)    Physical Exam Vitals reviewed.  Constitutional:      General: He is not in acute distress.    Appearance: Normal appearance. He is well-developed.  HENT:     Head: Normocephalic and atraumatic.     Right Ear: External ear normal.     Left Ear: External ear normal.  Eyes:     General: No scleral icterus.       Right eye: No discharge.        Left eye: No discharge.     Conjunctiva/sclera: Conjunctivae normal.  Cardiovascular:     Rate and Rhythm: Normal rate  and regular rhythm.  Pulmonary:     Effort: Pulmonary effort is normal. No respiratory distress.     Breath sounds: Normal breath sounds.  Abdominal:     General: Bowel sounds are normal.     Palpations: Abdomen is soft.     Tenderness: There is no abdominal tenderness.  Musculoskeletal:        General: No swelling or tenderness.     Cervical back: Neck supple. No tenderness.  Lymphadenopathy:     Cervical: No cervical adenopathy.  Skin:    Findings: No erythema or rash.  Neurological:     Mental Status: He is alert.  Psychiatric:        Mood and Affect: Mood normal.        Behavior: Behavior normal.      Outpatient Encounter Medications as of 12/25/2022  Medication Sig   empagliflozin (JARDIANCE) 25 MG TABS tablet Take 1 tablet (25 mg total) by mouth daily before breakfast.   insulin degludec (TRESIBA FLEXTOUCH) 100 UNIT/ML FlexTouch Pen Inject 10 Units into the skin daily.   loratadine (CLARITIN) 10 MG tablet Take 10 mg by mouth daily.   Multiple Vitamin (MULTIVITAMIN) tablet Take 1 tablet by mouth daily.   rosuvastatin (CRESTOR) 40 MG tablet TAKE 1 TABLET(40 MG) BY MOUTH DAILY   vitamin B-12 (CYANOCOBALAMIN) 100 MCG tablet Take 100 mcg by mouth daily.   No facility-administered encounter medications on file as of 12/25/2022.     Lab Results  Component Value Date   WBC 6.2 11/20/2022   HGB 14.5 11/20/2022   HCT 42.4 11/20/2022   PLT 198.0 11/20/2022   GLUCOSE 171 (H) 11/20/2022   CHOL 135 11/20/2022   TRIG 114.0 11/20/2022   HDL 34.60 (L) 11/20/2022   LDLDIRECT 58.0 03/27/2022   LDLCALC 78 11/20/2022   ALT 18 11/20/2022   AST 16 11/20/2022   NA 141 11/20/2022   K 4.5 11/20/2022   CL 104 11/20/2022   CREATININE 1.00 11/20/2022   BUN 22 11/20/2022   CO2 29 11/20/2022   TSH 1.52 11/20/2022   PSA 0.34 12/08/2021   HGBA1C 7.9 (H) 11/20/2022   MICROALBUR 0.9 04/23/2022       Assessment & Plan:  Type 2 diabetes mellitus with hyperglycemia, without long-term  current use of insulin ([place]) Assessment & Plan: Has had intolerance to metformin.  Currently on tresiba.  Tolerates jardiance 43m q day. Low carb diet and exercise.  Follow met  b and a1c.  In the process of seeing if ozempic covered.    Orders: -     Hemoglobin A1c; Future  Hypercholesterolemia Assessment & Plan: On crestor.  Low cholesterol diet and exercise.  Follow lipid panel and liver function tests.    Orders: -     Basic metabolic panel; Future -     Lipid panel; Future -     Hepatic function panel; Future  Thrombocytopenia (HCC) Assessment & Plan: Last cbc - platelet count wnl.  Follow.    Colon cancer screening Assessment & Plan: Discussed. Will notify me when agreeable for colonoscopy.   Getting new insurance.  Will need to check coverage.    Stress Assessment & Plan: Overall feels handling things relatively well. Does not feel needs any further intervention. Changing jobs.  Follow.    [place] Assessment & Plan: Discussed increased gas.  Probiotics.  Notify when agreeable for colonoscopy.        Einar Pheasant, MD

## 2022-12-25 NOTE — Patient Instructions (Signed)
Examples of probiotics:  culturelle, align or florastor

## 2022-12-26 ENCOUNTER — Encounter: Payer: Self-pay | Admitting: Internal Medicine

## 2022-12-26 DIAGNOSIS — R143 Flatulence: Secondary | ICD-10-CM | POA: Insufficient documentation

## 2022-12-26 NOTE — Assessment & Plan Note (Signed)
On crestor.  Low cholesterol diet and exercise.  Follow lipid panel and liver function tests.   

## 2022-12-26 NOTE — Assessment & Plan Note (Signed)
Overall feels handling things relatively well. Does not feel needs any further intervention. Changing jobs.  Follow.

## 2022-12-26 NOTE — Assessment & Plan Note (Signed)
Has had intolerance to metformin.  Currently on tresiba.  Tolerates jardiance 97m q day. Low carb diet and exercise.  Follow met b and a1c.  In the process of seeing if ozempic covered.

## 2022-12-26 NOTE — Assessment & Plan Note (Signed)
Discussed increased gas.  Probiotics.  Notify when agreeable for colonoscopy.

## 2022-12-26 NOTE — Assessment & Plan Note (Addendum)
Discussed. Will notify me when agreeable for colonoscopy.   Getting new insurance.  Will need to check coverage.

## 2022-12-26 NOTE — Assessment & Plan Note (Signed)
Last cbc - platelet count wnl.  Follow.

## 2023-01-14 ENCOUNTER — Other Ambulatory Visit: Payer: 59 | Admitting: Pharmacist

## 2023-01-14 ENCOUNTER — Telehealth: Payer: Self-pay | Admitting: Pharmacist

## 2023-01-14 MED ORDER — TRESIBA FLEXTOUCH 100 UNIT/ML ~~LOC~~ SOPN: 8.0000 [IU] | PEN_INJECTOR | Freq: Every day | SUBCUTANEOUS | 1 refills | Status: DC

## 2023-01-14 MED ORDER — OZEMPIC (0.25 OR 0.5 MG/DOSE) 2 MG/3ML ~~LOC~~ SOPN: PEN_INJECTOR | SUBCUTANEOUS | 2 refills | Status: DC

## 2023-01-14 NOTE — Progress Notes (Signed)
01/14/2023 Name: Edward Spencer MRN: [id number] DOB: 1971/10/10  Chief Complaint  Patient presents with   Medication Management   Diabetes    Edward Spencer is a 52 y.o. year old male who presented for a telephone visit.   They were referred to the pharmacist by their PCP for assistance in managing diabetes.   Subjective:  Care Team: Primary Care Provider: Einar Pheasant, MD ; Next Scheduled Visit: 5/9/24ozempi  Medication Access/Adherence  Current Pharmacy:  [facility] [place], [place] - [address] [facility] [place] [address] Phone: [phone] Fax: [place] [address] Phone: [phone] Fax: [phone]   Patient reports affordability concerns with their medications: No  Patient reports access/transportation concerns to their pharmacy: No  Patient reports adherence concerns with their medications:  No     Diabetes:  Current medications: Tresiba 10 units daily, Jardiance 25 mg daily Medications tried in past: did not tolerate metformin, Mounjaro  Current glucose readings: 150s fasting and post prandial  Reports he will start a new insurance plan in April that will cover Ozempic. He would like to try this.   Objective:  Lab Results  Component Value Date   HGBA1C 7.9 (H) 11/20/2022    Lab Results  Component Value Date   CREATININE 1.00 11/20/2022   BUN 22 11/20/2022   NA 141 11/20/2022   K 4.5 11/20/2022   CL 104 11/20/2022   CO2 29 11/20/2022    Lab Results  Component Value Date   CHOL 135 11/20/2022   HDL 34.60 (L) 11/20/2022   LDLCALC 78 11/20/2022   LDLDIRECT 58.0 03/27/2022   TRIG 114.0 11/20/2022   CHOLHDL 4 11/20/2022    Medications Reviewed Today     Reviewed by Einar Pheasant, MD (Physician) on 12/26/22 at 1727  Med List Status: <None>   Medication Order Taking? Sig Documenting  Provider Last Dose Status Informant  empagliflozin (JARDIANCE) 25 MG TABS tablet [id number] Yes Take 1 tablet (25 mg total) by mouth daily before breakfast. Einar Pheasant, MD Taking Active   insulin degludec (TRESIBA FLEXTOUCH) 100 UNIT/ML FlexTouch Pen [id number] Yes Inject 10 Units into the skin daily. Einar Pheasant, MD Taking Active   loratadine (CLARITIN) 10 MG tablet [id number] Yes Take 10 mg by mouth daily. [provider] Taking Active Self  Multiple Vitamin (MULTIVITAMIN) tablet [id number] Yes Take 1 tablet by mouth daily. [provider] Taking Active   rosuvastatin (CRESTOR) 40 MG tablet [id number] Yes TAKE 1 TABLET(40 MG) BY MOUTH DAILY Einar Pheasant, MD Taking Active   vitamin B-12 (CYANOCOBALAMIN) 100 MCG tablet [id number] Yes Take 100 mcg by mouth daily. [provider] Taking Active               Assessment/Plan:   Diabetes: - Currently uncontrolled - Reviewed long term cardiovascular and renal outcomes of uncontrolled blood sugar - Reviewed goal A1c, goal fasting, and goal 2 hour post prandial glucose - Recommend to start Ozempic 0.25 mg weekly for 4 weeks, then increase to 0.5 mg weekly. Will provide sample. Reduce Tresiba to 10 mg daily to 8 units daily to reduce risk of hypoglycemia.  - Recommend to check glucose twice daily, fasting and 2 hour post prandial - Recommend to continue Jardiance 25 mg    Follow Up Plan: follow up in 4 weeks  Catie Hedwig Morton, PharmD, BCACP, CPP  [facility] [phone]

## 2023-01-14 NOTE — Progress Notes (Signed)
Please prepare sample of Ozempic 0.25/0.5 mg (red box) for patient.

## 2023-01-14 NOTE — Patient Instructions (Signed)
Merric,   Start Ozempic 0.25 mg weekly for 4 weeks, then increase to 0.5 mg weekly if you are tolerating it. This medication may cause stomach upset, queasiness, or constipation, especially when first starting. This generally improves over time. Call our office if these symptoms occur and worsen, or if you have severe symptoms such as vomiting, diarrhea, or stomach pain.   Reduce Tresiba to 8 units daily. Continue Jardiance 25 mg daily.   Check your blood sugars twice daily:  1) Fasting, first thing in the morning before breakfast and  2) 2 hours after your largest meal.   For a goal A1c of less than 7%, goal fasting readings are less than 130 and goal 2 hour after meal readings are less than 180.    Call me if any issues.   Thanks!  Catie Hedwig Morton, PharmD, [place], [facility] [phone]

## 2023-01-15 ENCOUNTER — Telehealth: Payer: Self-pay | Admitting: *Deleted

## 2023-01-15 NOTE — Telephone Encounter (Signed)
Medication Samples have been provided to the patient. Pt notified that sample is in office for pick up Via mychart.  Drug name: Ozempic     Strength: '2mg'$ /3m      Qty: 1  LOT:[id number] Exp.Date: 05/15/24  Dosing instructions:  Inject 0.25 mg weekly for 4 weeks then increase to 0.5 mg weekly.    The patient has been instructed regarding the correct time, dose, and frequency of taking this medication, including desired effects and most common side effects.   LCannon Kettle8:22 AM 01/15/2023

## 2023-01-15 NOTE — Telephone Encounter (Signed)
Sample available for pick up. Message sent via mychart.

## 2023-01-26 ENCOUNTER — Other Ambulatory Visit: Payer: Self-pay | Admitting: Internal Medicine

## 2023-01-26 DIAGNOSIS — E7800 Pure hypercholesterolemia, unspecified: Secondary | ICD-10-CM

## 2023-02-18 ENCOUNTER — Other Ambulatory Visit: Payer: 59 | Admitting: Pharmacist

## 2023-02-18 ENCOUNTER — Encounter: Payer: Self-pay | Admitting: Pharmacist

## 2023-03-04 ENCOUNTER — Other Ambulatory Visit: Payer: 59 | Admitting: Pharmacist

## 2023-03-04 ENCOUNTER — Telehealth: Payer: Self-pay | Admitting: Pharmacist

## 2023-03-04 NOTE — Patient Instructions (Signed)
Benjamin,   Keep up the great work! Please reach out with any questions.   Thanks!  Catie Eppie Gibson, PharmD, BCACP, CPP [facility] [phone]

## 2023-03-04 NOTE — Progress Notes (Signed)
03/04/2023 Name: Edward Spencer MRN: [id number] DOB: 1971/07/09  Chief Complaint  Patient presents with   Medication Management   Diabetes    Edward Spencer is a 52 y.o. year old male who presented for a telephone visit.   They were referred to the pharmacist by their PCP for assistance in managing diabetes.    Subjective:  Care Team: Primary Care Provider: Dale Thatcher, MD ; Next Scheduled Visit: 03/2023  Medication Access/Adherence  Current Pharmacy:  [facility] #[id number], [place] - [address] AT [facility] [address] [facility] [place] [address] Phone: [phone] Fax: [phone]  [facility] [address] [place] [address] Phone: [phone] Fax: [phone]   Patient reports affordability concerns with their medications: No  Patient reports access/transportation concerns to their pharmacy: No  Patient reports adherence concerns with their medications:  No     Diabetes:  Current medications: Ozempic 0.5 mg weekly, Jardiance 25 mg daily, Tresiba 8 units daily   Current glucose readings: 30 day average 110-120s  Notes some GI upset, but generally tolerable.   Patient denies hypoglycemic s/sx including dizziness, shakiness, sweating. Patient denies hyperglycemic symptoms including polyuria, polydipsia, polyphagia, nocturia, neuropathy, blurred vision.   Objective:  Lab Results  Component Value Date   HGBA1C 7.9 (H) 11/20/2022    Lab Results  Component Value Date   CREATININE 1.00 11/20/2022   BUN 22 11/20/2022   NA 141 11/20/2022   K 4.5 11/20/2022   CL 104 11/20/2022   CO2 29 11/20/2022    Lab Results  Component Value Date   CHOL 135 11/20/2022   HDL 34.60 (L) 11/20/2022   LDLCALC 78 11/20/2022   LDLDIRECT 58.0 03/27/2022   TRIG 114.0 11/20/2022   CHOLHDL 4 11/20/2022    Medications Reviewed Today     Reviewed by Alden Hipp, RPH-CPP  (Pharmacist) on 01/14/23 at 1434  Med List Status: <None>   Medication Order Taking? Sig Documenting Provider Last Dose Status Informant  empagliflozin (JARDIANCE) 25 MG TABS tablet [id number] Yes Take 1 tablet (25 mg total) by mouth daily before breakfast. Dale West , MD Taking Active   insulin degludec (TRESIBA FLEXTOUCH) 100 UNIT/ML FlexTouch Pen [id number] Yes Inject 10 Units into the skin daily. Dale Harvest, MD Taking Active   loratadine (CLARITIN) 10 MG tablet [id number] Yes Take 10 mg by mouth daily. [provider] Taking Active Self  Multiple Vitamin (MULTIVITAMIN) tablet [id number] Yes Take 1 tablet by mouth daily. [provider] Taking Active   rosuvastatin (CRESTOR) 40 MG tablet [id number] Yes TAKE 1 TABLET(40 MG) BY MOUTH DAILY Dale Monrovia, MD Taking Active   vitamin B-12 (CYANOCOBALAMIN) 100 MCG tablet [id number] Yes Take 100 mcg by mouth daily. [provider] Taking Active               Assessment/Plan:   Diabetes: - Currently controlled per home glucose readings  - Reviewed goal A1c, goal fasting, and goal 2 hour post prandial glucose - Discussed that he can increase Ozempic to 0.5 mg and hold Tresiba. He declines, notes he would like to stay on current dose of Ozempic but will likely try reducing Tresiba dose. Discussed that if no change in glucose with reducing from 8 units to 5 units, he can likely discontinue [place].    Follow Up Plan: follow up with PCP as scheduled  Catie Eppie Gibson, PharmD, BCACP, CPP [facility] [phone]

## 2023-03-04 NOTE — Progress Notes (Signed)
Please pull sample of Ozempic 0.25/0.5 (red pen) for patient. Thanks!

## 2023-03-05 NOTE — Telephone Encounter (Signed)
Sample labeled & pt notified via mychart.

## 2023-03-22 ENCOUNTER — Other Ambulatory Visit: Payer: 59

## 2023-03-25 ENCOUNTER — Ambulatory Visit: Payer: 59 | Admitting: Internal Medicine

## 2023-04-20 ENCOUNTER — Encounter: Payer: Self-pay | Admitting: Internal Medicine

## 2023-04-21 NOTE — Telephone Encounter (Signed)
Patient needing PA for jardiance and ozempic

## 2023-04-23 ENCOUNTER — Telehealth: Payer: Self-pay

## 2023-04-23 ENCOUNTER — Other Ambulatory Visit (HOSPITAL_COMMUNITY): Payer: Self-pay

## 2023-04-23 NOTE — Telephone Encounter (Signed)
Patient Advocate Encounter  Prior Authorization for Jardiance 25mg  has been approved with [employer].    PA# [id number] Effective dates: 04/23/23 through 11/15/2098  Per [employer] test claim, copay for 90 days supply is $29.99

## 2023-04-23 NOTE — Telephone Encounter (Signed)
Pharmacy Patient Advocate Encounter   Received notification from [facility] that prior authorization for Jardiance 25MG  tablets is required/requested.   PA submitted to [employer] via CoverMyMeds Key or (Medicaid) confirmation # [id number] Status is pending

## 2023-04-23 NOTE — Telephone Encounter (Signed)
Pharmacy Patient Advocate Encounter   Received notification that prior authorization for Ozempic (0.25 or 0.5 MG/DOSE) 2MG /3ML pen-injectors is required/requested.   PA submitted to [employer] via CoverMyMeds Key or [facility]) confirmation #  [id number] Status is pending

## 2023-04-23 NOTE — Telephone Encounter (Addendum)
Patient Advocate Encounter  Prior Authorization for Ozempic (0.25 or 0.5 MG/DOSE) 2MG /3ML pen-injectors has been approved with [employer].    PA# [id number] Effective dates: 04/23/23 through 04/22/24  Per [employer] test claim, copay for 28 days supply is $24.99 (with [employer])

## 2023-04-30 ENCOUNTER — Ambulatory Visit: Payer: Managed Care, Other (non HMO) | Admitting: Internal Medicine

## 2023-04-30 ENCOUNTER — Encounter: Payer: Self-pay | Admitting: Internal Medicine

## 2023-04-30 VITALS — BP 128/70 | HR 79 | Temp 98.0°F | Resp 16 | Ht 73.0 in | Wt 236.8 lb

## 2023-04-30 DIAGNOSIS — D696 Thrombocytopenia, unspecified: Secondary | ICD-10-CM

## 2023-04-30 DIAGNOSIS — E7800 Pure hypercholesterolemia, unspecified: Secondary | ICD-10-CM | POA: Diagnosis not present

## 2023-04-30 DIAGNOSIS — E1165 Type 2 diabetes mellitus with hyperglycemia: Secondary | ICD-10-CM | POA: Diagnosis not present

## 2023-04-30 DIAGNOSIS — Z7984 Long term (current) use of oral hypoglycemic drugs: Secondary | ICD-10-CM

## 2023-04-30 DIAGNOSIS — F439 Reaction to severe stress, unspecified: Secondary | ICD-10-CM | POA: Diagnosis not present

## 2023-04-30 DIAGNOSIS — Z7985 Long-term (current) use of injectable non-insulin antidiabetic drugs: Secondary | ICD-10-CM

## 2023-04-30 DIAGNOSIS — Z1211 Encounter for screening for malignant neoplasm of colon: Secondary | ICD-10-CM

## 2023-04-30 LAB — HEMOGLOBIN A1C: Hgb A1c MFr Bld: 6.3 % (ref 4.6–6.5)

## 2023-04-30 LAB — LIPID PANEL
Cholesterol: 157 mg/dL (ref 0–200)
HDL: 35.5 mg/dL — ABNORMAL LOW (ref >39.00)
LDL Cholesterol: 90 mg/dL (ref 0–99)
NonHDL: 121.83
Total CHOL/HDL Ratio: 4
Triglycerides: 161 mg/dL — ABNORMAL HIGH (ref 0.0–149.0)
VLDL: 32.2 mg/dL (ref 0.0–40.0)

## 2023-04-30 LAB — HEPATIC FUNCTION PANEL
ALT: 13 U/L (ref 0–53)
AST: 14 U/L (ref 0–37)
Albumin: 4.5 g/dL (ref 3.5–5.2)
Alkaline Phosphatase: 60 U/L (ref 39–117)
Bilirubin, Direct: 0.1 mg/dL (ref 0.0–0.3)
Total Bilirubin: 0.6 mg/dL (ref 0.2–1.2)
Total Protein: 7.7 g/dL (ref 6.0–8.3)

## 2023-04-30 LAB — BASIC METABOLIC PANEL
BUN: 22 mg/dL (ref 6–23)
CO2: 27 mEq/L (ref 19–32)
Calcium: 9.7 mg/dL (ref 8.4–10.5)
Chloride: 102 mEq/L (ref 96–112)
Creatinine, Ser: 1.04 mg/dL (ref 0.40–1.50)
GFR: 82.11 mL/min (ref >60.00)
Glucose, Bld: 110 mg/dL — ABNORMAL HIGH (ref 70–99)
Potassium: 4.2 mEq/L (ref 3.5–5.1)
Sodium: 139 mEq/L (ref 135–145)

## 2023-04-30 LAB — MICROALBUMIN / CREATININE URINE RATIO
Creatinine,U: 61.3 mg/dL
Microalb Creat Ratio: 1.1 mg/g (ref 0.0–30.0)
Microalb, Ur: <0.7 mg/dL (ref 0.0–1.9)

## 2023-04-30 MED ORDER — ROSUVASTATIN CALCIUM 40 MG PO TABS
40.0000 mg | ORAL_TABLET | Freq: Every day | ORAL | 3 refills | Status: DC
Start: 2023-04-30 — End: 2023-10-27

## 2023-04-30 MED ORDER — EMPAGLIFLOZIN 25 MG PO TABS: 25.0000 mg | ORAL_TABLET | Freq: Every day | ORAL | 1 refills | Status: DC

## 2023-04-30 NOTE — Progress Notes (Unsigned)
Subjective:    Patient ID: Edward Spencer, male    DOB: 1970-12-21, 52 y.o.   MRN: [id number]  Patient here for  Chief Complaint  Patient presents with   Medical Management of Chronic Issues    HPI Here to follow up regarding his diabetes.  On jardiance and ozempic.  Trying to titrate up ozempic. On tresiba.  Had intolerance to metformin.  Sugars improved.  7 day average blood sugar 125.  Has lost weight. Stays active.  No chest pain or sob reported.  Bowels stable.  No nausea. Discussed colonoscopy.  He wants to hold at this point.  Will notify me if desires to pursue colonoscopy.     Past Medical History:  Diagnosis Date   Allergy    Arthritis    Asthma    Depression    Diabetes mellitus without complication (HCC)    History of chicken pox    History reviewed. No pertinent surgical history. Family History  Problem Relation Age of Onset   Arthritis Mother    Hyperlipidemia Mother    Arthritis Father    Hypertension Father    Diabetes Father    Mental illness Maternal Aunt    Hyperlipidemia Maternal Aunt    Alcohol abuse Maternal Aunt    Mental illness Paternal Aunt    Alcohol abuse Paternal Aunt    Arthritis Paternal Uncle    Arthritis Maternal Grandmother    Arthritis Paternal Grandmother    Hyperlipidemia Paternal Grandmother    Diabetes Paternal Grandmother    Social History   Socioeconomic History   Marital status: Single    Spouse name: Not on file   Number of children: Not on file   Years of education: Not on file   Highest education level: Not on file  Occupational History   Not on file  Tobacco Use   Smoking status: Every Day    Packs/day: 0.25    Years: 40.00    Additional pack years: 0.00    Total pack years: 10.00    Types: Cigarettes   Smokeless tobacco: Never   Tobacco comments:    1.5 pack per week- 07/22/2022  Vaping Use   Vaping Use: Never used  Substance and Sexual Activity   Alcohol use: Yes    Alcohol/week: 0.0 standard drinks of  alcohol    Comment: social   Drug use: No   Sexual activity: Not on file  Other Topics Concern   Not on file  Social History Narrative   Not on file   Social Determinants of Health   Financial Resource Strain: Low Risk  (09/26/2021)   Overall Financial Resource Strain (CARDIA)    Difficulty of Paying Living Expenses: Not hard at all  Food Insecurity: Not on file  Transportation Needs: Not on file  Physical Activity: Not on file  Stress: Not on file  Social Connections: Not on file     Review of Systems     Objective:     BP 128/70   Pulse 79   Temp 98 F (36.7 C)   Resp 16   Ht 6\' 1"  (1.854 m)   Wt 236 lb 12.8 oz (107.4 kg)   SpO2 98%   BMI 31.24 kg/m  Wt Readings from Last 3 Encounters:  04/30/23 236 lb 12.8 oz (107.4 kg)  12/25/22 249 lb (112.9 kg)  09/21/22 246 lb 12.8 oz (111.9 kg)    Physical Exam   Outpatient Encounter Medications as of 04/30/2023  Medication  Sig   empagliflozin (JARDIANCE) 25 MG TABS tablet Take 1 tablet (25 mg total) by mouth daily before breakfast.   insulin degludec (TRESIBA FLEXTOUCH) 100 UNIT/ML FlexTouch Pen Inject 8 Units into the skin daily.   loratadine (CLARITIN) 10 MG tablet Take 10 mg by mouth daily.   Multiple Vitamin (MULTIVITAMIN) tablet Take 1 tablet by mouth daily.   rosuvastatin (CRESTOR) 40 MG tablet TAKE 1 TABLET(40 MG) BY MOUTH DAILY   Semaglutide,0.25 or 0.5MG /DOS, (OZEMPIC, 0.25 OR 0.5 MG/DOSE,) 2 MG/3ML SOPN Inject 0.25 mg weekly for 4 weeks then increase to 0.5 mg weekly.   vitamin B-12 (CYANOCOBALAMIN) 100 MCG tablet Take 100 mcg by mouth daily.   No facility-administered encounter medications on file as of 04/30/2023.     Lab Results  Component Value Date   WBC 6.2 11/20/2022   HGB 14.5 11/20/2022   HCT 42.4 11/20/2022   PLT 198.0 11/20/2022   GLUCOSE 171 (H) 11/20/2022   CHOL 135 11/20/2022   TRIG 114.0 11/20/2022   HDL 34.60 (L) 11/20/2022   LDLDIRECT 58.0 03/27/2022   LDLCALC 78 11/20/2022    ALT 18 11/20/2022   AST 16 11/20/2022   NA 141 11/20/2022   K 4.5 11/20/2022   CL 104 11/20/2022   CREATININE 1.00 11/20/2022   BUN 22 11/20/2022   CO2 29 11/20/2022   TSH 1.52 11/20/2022   PSA 0.34 12/08/2021   HGBA1C 7.9 (H) 11/20/2022   MICROALBUR 0.9 04/23/2022    No results found.     Assessment & Plan:  Type 2 diabetes mellitus with hyperglycemia, without long-term current use of insulin (HCC) -     Microalbumin / creatinine urine ratio -     Hemoglobin A1c  Hypercholesterolemia -     Hepatic function panel -     Lipid panel -     Basic metabolic panel     Dale Waverly, MD

## 2023-05-01 ENCOUNTER — Encounter: Payer: Self-pay | Admitting: Internal Medicine

## 2023-05-01 NOTE — Assessment & Plan Note (Signed)
Discussed. Will notify me when agreeable for colonoscopy.

## 2023-05-01 NOTE — Assessment & Plan Note (Signed)
Has had intolerance to metformin.  Currently on tresiba.  Tolerates jardiance 25mg  q day. Also tolerating ozempic.  Titrating up.  Sugars improved. Low carb diet and exercise.  Follow met b and a1c.

## 2023-05-01 NOTE — Assessment & Plan Note (Signed)
Overall feels handling things relatively well. Does not feel needs any further intervention. Doing better with job change. Follow.

## 2023-05-01 NOTE — Assessment & Plan Note (Signed)
On crestor.  Low cholesterol diet and exercise.  Follow lipid panel and liver function tests.   

## 2023-05-01 NOTE — Assessment & Plan Note (Signed)
Last cbc - platelet count wnl.  Follow.  

## 2023-05-03 ENCOUNTER — Other Ambulatory Visit: Payer: Self-pay

## 2023-05-03 MED ORDER — OZEMPIC (0.25 OR 0.5 MG/DOSE) 2 MG/3ML ~~LOC~~ SOPN: 0.5000 mg | PEN_INJECTOR | SUBCUTANEOUS | 2 refills | Status: DC

## 2023-06-18 ENCOUNTER — Encounter: Payer: Self-pay | Admitting: Internal Medicine

## 2023-06-21 NOTE — Telephone Encounter (Signed)
Called patient for update. He has persistent sinus congestion, head ache and cough for the last few days. Has COVID tested 3 times- all 3 negative. No sob, wheezing, chest tightness, fever, etc. Says that 3 other people at work have called in with COVID today. Taking mucinex and feeling some better today. Patient has been scheduled for a VV tomorrow to discuss with you

## 2023-06-22 ENCOUNTER — Encounter: Payer: Self-pay | Admitting: Internal Medicine

## 2023-06-22 ENCOUNTER — Telehealth: Payer: BC Managed Care – PPO | Admitting: Internal Medicine

## 2023-06-22 DIAGNOSIS — E1165 Type 2 diabetes mellitus with hyperglycemia: Secondary | ICD-10-CM | POA: Diagnosis not present

## 2023-06-22 DIAGNOSIS — J329 Chronic sinusitis, unspecified: Secondary | ICD-10-CM

## 2023-06-22 MED ORDER — AMOXICILLIN-POT CLAVULANATE 875-125 MG PO TABS: 1.0000 | ORAL_TABLET | Freq: Two times a day (BID) | ORAL | 0 refills | Status: DC

## 2023-06-22 NOTE — Progress Notes (Signed)
Patient ID: Edward Spencer, male   DOB: 1971/01/13, 52 y.o.   MRN: [id number]   Virtual Visit via video Note  I connected with Edward Spencer by a video enabled telemedicine application and verified that I am speaking with the correct person using two identifiers. Location patient: home Location provider: work Persons participating in the virtual visit: patient, provider  The limitations, risks, security and privacy concerns of performing an evaluation and management service by video and the availability of in person appointments have been discussed.  It has also been discussed with the patient that there may be a patient responsible charge related to this service. The patient expressed understanding and agreed to proceed.   Reason for visit: work in appt.   HPI: Work in appt - work in for increased sinus congestion.  States symptoms started approximately 5 days ago. Increased sinus pressure.  No fever.  Nasal congestion.  No earache.  No sore throat.  No cough or chest congestion.  No sob.  No nausea or vomiting.  Symptoms persistent despite afrin nasal spray, mucinex and sudafed.  Feels like typical sinus infection.  Has taken three covid test - all negative.     ROS: See pertinent positives and negatives per HPI.  Past Medical History:  Diagnosis Date   Allergy    Arthritis    Asthma    Depression    Diabetes mellitus without complication (HCC)    History of chicken pox     History reviewed. No pertinent surgical history.  Family History  Problem Relation Age of Onset   Arthritis Mother    Hyperlipidemia Mother    Arthritis Father    Hypertension Father    Diabetes Father    Mental illness Maternal Aunt    Hyperlipidemia Maternal Aunt    Alcohol abuse Maternal Aunt    Mental illness Paternal Aunt    Alcohol abuse Paternal Aunt    Arthritis Paternal Uncle    Arthritis Maternal Grandmother    Arthritis Paternal Grandmother    Hyperlipidemia Paternal Grandmother     Diabetes Paternal Grandmother     SOCIAL HX: reviewed.    Current Outpatient Medications:    amoxicillin-clavulanate (AUGMENTIN) 875-125 MG tablet, Take 1 tablet by mouth 2 (two) times daily., Disp: 20 tablet, Rfl: 0   empagliflozin (JARDIANCE) 25 MG TABS tablet, Take 1 tablet (25 mg total) by mouth daily before breakfast., Disp: 90 tablet, Rfl: 1   insulin degludec (TRESIBA FLEXTOUCH) 100 UNIT/ML FlexTouch Pen, Inject 8 Units into the skin daily., Disp: 9 mL, Rfl: 1   loratadine (CLARITIN) 10 MG tablet, Take 10 mg by mouth daily., Disp: , Rfl:    Multiple Vitamin (MULTIVITAMIN) tablet, Take 1 tablet by mouth daily., Disp: , Rfl:    rosuvastatin (CRESTOR) 40 MG tablet, Take 1 tablet (40 mg total) by mouth daily., Disp: 90 tablet, Rfl: 3   Semaglutide,0.25 or 0.5MG /DOS, (OZEMPIC, 0.25 OR 0.5 MG/DOSE,) 2 MG/3ML SOPN, Inject 0.5 mg into the skin once a week., Disp: 3 mL, Rfl: 2   vitamin B-12 (CYANOCOBALAMIN) 100 MCG tablet, Take 100 mcg by mouth daily., Disp: , Rfl:   EXAM:  GENERAL: alert, oriented, appears well and in no acute distress  HEENT: atraumatic, conjunttiva clear, no obvious abnormalities on inspection of external nose and ears  NECK: normal movements of the head and neck  LUNGS: on inspection no signs of respiratory distress, breathing rate appears normal, no obvious gross SOB, gasping or wheezing  CV: no obvious  cyanosis  PSYCH/NEURO: pleasant and cooperative, no obvious depression or anxiety, speech and thought processing grossly intact  ASSESSMENT AND PLAN:  Discussed the following assessment and plan:  Problem List Items Addressed This Visit     Sinusitis - Primary    Symptoms as outlined.  Increased sinus pressure and congestion.  Has been using afrin nasal spray and mucinex.  Given persistent symptoms despite otc treatment as outlined, will treat with abx - augmentin as directed.  Saline nasal spray and steroid nasal spray as directed.  Continue mucinex.  Follow.   Call with update.       Relevant Medications   amoxicillin-clavulanate (AUGMENTIN) 875-125 MG tablet   Diabetes mellitus (HCC)    Continue low card diet and exercise.  Continue jardiance and ozmpic.  Follow sugars.        Return if symptoms worsen or fail to improve.   I discussed the assessment and treatment plan with the patient. The patient was provided an opportunity to ask questions and all were answered. The patient agreed with the plan and demonstrated an understanding of the instructions.   The patient was advised to call back or seek an in-person evaluation if the symptoms worsen or if the condition fails to improve as anticipated.   Dale [place], MD

## 2023-06-27 ENCOUNTER — Encounter: Payer: Self-pay | Admitting: Internal Medicine

## 2023-06-27 NOTE — Assessment & Plan Note (Signed)
Continue low card diet and exercise.  Continue jardiance and ozmpic.  Follow sugars.

## 2023-06-27 NOTE — Assessment & Plan Note (Signed)
Symptoms as outlined.  Increased sinus pressure and congestion.  Has been using afrin nasal spray and mucinex.  Given persistent symptoms despite otc treatment as outlined, will treat with abx - augmentin as directed.  Saline nasal spray and steroid nasal spray as directed.  Continue mucinex.  Follow.  Call with update.

## 2023-07-01 ENCOUNTER — Encounter (INDEPENDENT_AMBULATORY_CARE_PROVIDER_SITE_OTHER): Payer: Self-pay

## 2023-07-24 ENCOUNTER — Encounter: Payer: Self-pay | Admitting: Internal Medicine

## 2023-07-27 ENCOUNTER — Encounter: Payer: Self-pay | Admitting: Internal Medicine

## 2023-07-28 ENCOUNTER — Telehealth: Payer: Self-pay

## 2023-07-28 ENCOUNTER — Other Ambulatory Visit (HOSPITAL_COMMUNITY): Payer: Self-pay

## 2023-07-28 NOTE — Telephone Encounter (Signed)
PA request has been Submitted and is currently pending. New Encounter created for follow up. For additional info see Pharmacy Prior Auth telephone encounter from 07-28-2023.

## 2023-07-28 NOTE — Telephone Encounter (Signed)
PA request has been Submitted. New Encounter created for follow up. For additional info see Pharmacy Prior Auth telephone encounter from 07-28-2023.

## 2023-07-28 NOTE — Telephone Encounter (Signed)
Patient needs PA for Ozempic.

## 2023-07-28 NOTE — Telephone Encounter (Signed)
Pharmacy Patient Advocate Encounter   Received notification from Patient Advice Request messages that prior authorization for Ozempic (0.25 or 0.5 MG/DOSE) 2MG /3ML pen-injectors is required/requested.   Insurance verification completed.   The patient is insured through [employer] .   Per test claim: PA required; PA submitted to [employer] via CoverMyMeds Key/confirmation #/[id number] [facility] Status is pending

## 2023-07-28 NOTE — Telephone Encounter (Signed)
Patient's spouse, Tito Dine, called to follow-up on patient's previous message regarding prior authorization.  I let her know that the request for a prior authorization for Ozempic has been forwarded to our prior auth team.  Jodie states patient is out of this medication.

## 2023-07-29 ENCOUNTER — Other Ambulatory Visit (HOSPITAL_COMMUNITY): Payer: Self-pay

## 2023-07-29 NOTE — Telephone Encounter (Signed)
My chart sent to patient to let him know.

## 2023-07-29 NOTE — Telephone Encounter (Signed)
See mychart.  

## 2023-07-29 NOTE — Telephone Encounter (Signed)
Noted  

## 2023-07-29 NOTE — Telephone Encounter (Signed)
Pharmacy Patient Advocate Encounter  Received notification from [facility] that Prior Authorization for Ozempic has been APPROVED from 06/28/2023 to 07/27/2024. Ran test claim, Copay is $50.00. This test claim was processed through [facility]- copay amounts may vary at other pharmacies due to pharmacy/plan contracts, or as the patient moves through the different stages of their insurance plan.   PA #/Case ID/Reference #: [id number]

## 2023-08-06 ENCOUNTER — Encounter: Payer: Self-pay | Admitting: Internal Medicine

## 2023-08-06 ENCOUNTER — Ambulatory Visit: Payer: BC Managed Care – PPO | Admitting: Internal Medicine

## 2023-08-06 VITALS — BP 118/72 | HR 71 | Temp 98.2°F | Ht 73.0 in | Wt 240.6 lb

## 2023-08-06 DIAGNOSIS — Z1211 Encounter for screening for malignant neoplasm of colon: Secondary | ICD-10-CM

## 2023-08-06 DIAGNOSIS — E7800 Pure hypercholesterolemia, unspecified: Secondary | ICD-10-CM

## 2023-08-06 DIAGNOSIS — Z7985 Long-term (current) use of injectable non-insulin antidiabetic drugs: Secondary | ICD-10-CM | POA: Diagnosis not present

## 2023-08-06 DIAGNOSIS — F439 Reaction to severe stress, unspecified: Secondary | ICD-10-CM

## 2023-08-06 DIAGNOSIS — D696 Thrombocytopenia, unspecified: Secondary | ICD-10-CM

## 2023-08-06 DIAGNOSIS — E1165 Type 2 diabetes mellitus with hyperglycemia: Secondary | ICD-10-CM | POA: Diagnosis not present

## 2023-08-06 DIAGNOSIS — Z125 Encounter for screening for malignant neoplasm of prostate: Secondary | ICD-10-CM | POA: Diagnosis not present

## 2023-08-06 DIAGNOSIS — Z720 Tobacco use: Secondary | ICD-10-CM

## 2023-08-06 LAB — LIPID PANEL
Cholesterol: 122 mg/dL (ref 0–200)
HDL: 41.3 mg/dL (ref >39.00)
LDL Cholesterol: 49 mg/dL (ref 0–99)
NonHDL: 80.94
Total CHOL/HDL Ratio: 3
Triglycerides: 158 mg/dL — ABNORMAL HIGH (ref 0.0–149.0)
VLDL: 31.6 mg/dL (ref 0.0–40.0)

## 2023-08-06 LAB — HEPATIC FUNCTION PANEL
ALT: 18 U/L (ref 0–53)
AST: 14 U/L (ref 0–37)
Albumin: 4.5 g/dL (ref 3.5–5.2)
Alkaline Phosphatase: 60 U/L (ref 39–117)
Bilirubin, Direct: 0.1 mg/dL (ref 0.0–0.3)
Total Bilirubin: 0.6 mg/dL (ref 0.2–1.2)
Total Protein: 7.1 g/dL (ref 6.0–8.3)

## 2023-08-06 LAB — CBC WITH DIFFERENTIAL/PLATELET
Basophils Absolute: 0 10*3/uL (ref 0.0–0.1)
Basophils Relative: 0.5 % (ref 0.0–3.0)
Eosinophils Absolute: 0.2 10*3/uL (ref 0.0–0.7)
Eosinophils Relative: 3.5 % (ref 0.0–5.0)
HCT: 44.8 % (ref 39.0–52.0)
Hemoglobin: 14.7 g/dL (ref 13.0–17.0)
Lymphocytes Relative: 34.2 % (ref 12.0–46.0)
Lymphs Abs: 1.7 10*3/uL (ref 0.7–4.0)
MCHC: 32.8 g/dL (ref 30.0–36.0)
MCV: 93.3 fl (ref 78.0–100.0)
Monocytes Absolute: 0.5 10*3/uL (ref 0.1–1.0)
Monocytes Relative: 9.7 % (ref 3.0–12.0)
Neutro Abs: 2.7 10*3/uL (ref 1.4–7.7)
Neutrophils Relative %: 52.1 % (ref 43.0–77.0)
Platelets: 151 10*3/uL (ref 150.0–400.0)
RBC: 4.8 Mil/uL (ref 4.22–5.81)
RDW: 13.3 % (ref 11.5–15.5)
WBC: 5.1 10*3/uL (ref 4.0–10.5)

## 2023-08-06 LAB — BASIC METABOLIC PANEL
BUN: 22 mg/dL (ref 6–23)
CO2: 28 mEq/L (ref 19–32)
Calcium: 9.5 mg/dL (ref 8.4–10.5)
Chloride: 103 mEq/L (ref 96–112)
Creatinine, Ser: 0.98 mg/dL (ref 0.40–1.50)
GFR: 88.01 mL/min (ref >60.00)
Glucose, Bld: 117 mg/dL — ABNORMAL HIGH (ref 70–99)
Potassium: 4.4 mEq/L (ref 3.5–5.1)
Sodium: 139 mEq/L (ref 135–145)

## 2023-08-06 LAB — PSA: PSA: 0.38 ng/mL (ref 0.10–4.00)

## 2023-08-06 LAB — TSH: TSH: 1.15 u[IU]/mL (ref 0.35–5.50)

## 2023-08-06 LAB — HEMOGLOBIN A1C: Hgb A1c MFr Bld: 6.6 % — ABNORMAL HIGH (ref 4.6–6.5)

## 2023-08-06 MED ORDER — OZEMPIC (0.25 OR 0.5 MG/DOSE) 2 MG/3ML ~~LOC~~ SOPN: 0.5000 mg | PEN_INJECTOR | SUBCUTANEOUS | 2 refills | Status: DC

## 2023-08-06 MED ORDER — EMPAGLIFLOZIN 25 MG PO TABS: 25.0000 mg | ORAL_TABLET | Freq: Every day | ORAL | 1 refills | Status: DC

## 2023-08-06 NOTE — Progress Notes (Unsigned)
Subjective:    Patient ID: Edward Spencer, male    DOB: Jan 28, 1971, 52 y.o.   MRN: [id number]  Patient here for  Chief Complaint  Patient presents with   Medication Management    HPI Here to follow up regarding his diabetes. On jardiance and ozempic.  On tresiba.  Had intolerance to metformin. Reports he is doing well. Blood sugars in am averaging 110-125 and pm 100-125.  No chest pain or sob reported.  No cough or congestion.  No abdominal pain.  Occasionally some minimal constipation.  Regulates with increased water intake.  Discussed lung cancer screening given history of tobacco use.     Past Medical History:  Diagnosis Date   Allergy    Arthritis    Asthma    Depression    Diabetes mellitus without complication (HCC)    History of chicken pox    No past surgical history on file. Family History  Problem Relation Age of Onset   Arthritis Mother    Hyperlipidemia Mother    Arthritis Father    Hypertension Father    Diabetes Father    Mental illness Maternal Aunt    Hyperlipidemia Maternal Aunt    Alcohol abuse Maternal Aunt    Mental illness Paternal Aunt    Alcohol abuse Paternal Aunt    Arthritis Paternal Uncle    Arthritis Maternal Grandmother    Arthritis Paternal Grandmother    Hyperlipidemia Paternal Grandmother    Diabetes Paternal Grandmother    Social History   Socioeconomic History   Marital status: Single    Spouse name: Not on file   Number of children: Not on file   Years of education: Not on file   Highest education level: Associate degree: occupational, Scientist, product/process development, or vocational program  Occupational History   Not on file  Tobacco Use   Smoking status: Every Day    Current packs/day: 0.25    Average packs/day: 0.3 packs/day for 40.0 years (10.0 ttl pk-yrs)    Types: Cigarettes   Smokeless tobacco: Never   Tobacco comments:    1.5 pack per week- 07/22/2022  Vaping Use   Vaping status: Never Used  Substance and Sexual Activity   Alcohol  use: Yes    Alcohol/week: 0.0 standard drinks of alcohol    Comment: social   Drug use: No   Sexual activity: Not on file  Other Topics Concern   Not on file  Social History Narrative   Not on file   Social Determinants of Health   Financial Resource Strain: Low Risk  (08/05/2023)   Overall Financial Resource Strain (CARDIA)    Difficulty of Paying Living Expenses: Not very hard  Food Insecurity: No Food Insecurity (08/05/2023)   Hunger Vital Sign    Worried About Running Out of Food in the Last Year: Never true    Ran Out of Food in the Last Year: Never true  Transportation Needs: No Transportation Needs (08/05/2023)   PRAPARE - Administrator, Civil Service (Medical): No    Lack of Transportation (Non-Medical): No  Physical Activity: Sufficiently Active (08/05/2023)   Exercise Vital Sign    Days of Exercise per Week: 5 days    Minutes of Exercise per Session: 30 min  Stress: Stress Concern Present (08/05/2023)   [employer] of Occupational Health - Occupational Stress Questionnaire    Feeling of Stress : To some extent  Social Connections: Unknown (08/05/2023)   Social Connection and Isolation Panel [NHANES]  Frequency of Communication with Friends and Family: More than three times a week    Frequency of Social Gatherings with Friends and Family: Three times a week    Attends Religious Services: Patient declined    Active Member of Clubs or Organizations: Yes    Attends Banker Meetings: More than 4 times per year    Marital Status: Divorced     Review of Systems  Constitutional:  Negative for appetite change and unexpected weight change.  HENT:  Negative for congestion and sinus pressure.   Respiratory:  Negative for cough, chest tightness and shortness of breath.   Cardiovascular:  Negative for chest pain, palpitations and leg swelling.  Gastrointestinal:  Negative for abdominal pain, diarrhea, nausea and vomiting.  Genitourinary:  Negative  for difficulty urinating and dysuria.  Musculoskeletal:  Negative for joint swelling and myalgias.  Skin:  Negative for color change and rash.  Neurological:  Negative for dizziness and headaches.  Psychiatric/Behavioral:  Negative for agitation and dysphoric mood.        Objective:     BP 118/72   Pulse 71   Temp 98.2 F (36.8 C) (Oral)   Ht 6\' 1"  (1.854 m)   Wt 240 lb 9.6 oz (109.1 kg)   SpO2 97%   BMI 31.74 kg/m  Wt Readings from Last 3 Encounters:  08/06/23 240 lb 9.6 oz (109.1 kg)  04/30/23 236 lb 12.8 oz (107.4 kg)  12/25/22 249 lb (112.9 kg)    Physical Exam Constitutional:      General: He is not in acute distress.    Appearance: Normal appearance. He is well-developed.  HENT:     Head: Normocephalic and atraumatic.     Right Ear: External ear normal.     Left Ear: External ear normal.  Eyes:     General: No scleral icterus.       Right eye: No discharge.        Left eye: No discharge.  Cardiovascular:     Rate and Rhythm: Normal rate and regular rhythm.  Pulmonary:     Effort: Pulmonary effort is normal. No respiratory distress.     Breath sounds: Normal breath sounds.  Abdominal:     General: Bowel sounds are normal.     Palpations: Abdomen is soft.     Tenderness: There is no abdominal tenderness.  Musculoskeletal:        General: No swelling or tenderness.     Cervical back: Neck supple. No tenderness.  Lymphadenopathy:     Cervical: No cervical adenopathy.  Skin:    Findings: No erythema or rash.  Neurological:     Mental Status: He is alert.  Psychiatric:        Mood and Affect: Mood normal.        Behavior: Behavior normal.      Outpatient Encounter Medications as of 08/06/2023  Medication Sig   empagliflozin (JARDIANCE) 25 MG TABS tablet Take 1 tablet (25 mg total) by mouth daily before breakfast.   insulin degludec (TRESIBA FLEXTOUCH) 100 UNIT/ML FlexTouch Pen Inject 8 Units into the skin daily.   loratadine (CLARITIN) 10 MG tablet  Take 10 mg by mouth daily.   Multiple Vitamin (MULTIVITAMIN) tablet Take 1 tablet by mouth daily.   rosuvastatin (CRESTOR) 40 MG tablet Take 1 tablet (40 mg total) by mouth daily.   Semaglutide,0.25 or 0.5MG /DOS, (OZEMPIC, 0.25 OR 0.5 MG/DOSE,) 2 MG/3ML SOPN Inject 0.5 mg into the skin once a week.  vitamin B-12 (CYANOCOBALAMIN) 100 MCG tablet Take 100 mcg by mouth daily.   [DISCONTINUED] amoxicillin-clavulanate (AUGMENTIN) 875-125 MG tablet Take 1 tablet by mouth 2 (two) times daily.   [DISCONTINUED] empagliflozin (JARDIANCE) 25 MG TABS tablet Take 1 tablet (25 mg total) by mouth daily before breakfast.   [DISCONTINUED] Semaglutide,0.25 or 0.5MG /DOS, (OZEMPIC, 0.25 OR 0.5 MG/DOSE,) 2 MG/3ML SOPN Inject 0.5 mg into the skin once a week.   No facility-administered encounter medications on file as of 08/06/2023.     Lab Results  Component Value Date   WBC 6.2 11/20/2022   HGB 14.5 11/20/2022   HCT 42.4 11/20/2022   PLT 198.0 11/20/2022   GLUCOSE 110 (H) 04/30/2023   CHOL 157 04/30/2023   TRIG 161.0 (H) 04/30/2023   HDL 35.50 (L) 04/30/2023   LDLDIRECT 58.0 03/27/2022   LDLCALC 90 04/30/2023   ALT 13 04/30/2023   AST 14 04/30/2023   NA 139 04/30/2023   K 4.2 04/30/2023   CL 102 04/30/2023   CREATININE 1.04 04/30/2023   BUN 22 04/30/2023   CO2 27 04/30/2023   TSH 1.52 11/20/2022   PSA 0.34 12/08/2021   HGBA1C 6.3 04/30/2023   MICROALBUR <0.7 04/30/2023    No results found.     Assessment & Plan:  Type 2 diabetes mellitus with hyperglycemia, without long-term current use of insulin (HCC) -     Hemoglobin A1c  Hypercholesterolemia -     Basic metabolic panel -     Hepatic function panel -     Lipid panel -     CBC with Differential/Platelet -     TSH  Stress  Prostate cancer screening -     PSA  Other orders -     Empagliflozin; Take 1 tablet (25 mg total) by mouth daily before breakfast.  Dispense: 90 tablet; Refill: 1 -     Ozempic (0.25 or 0.5 MG/DOSE); Inject  0.5 mg into the skin once a week.  Dispense: 3 mL; Refill: 2     Dale [place], MD

## 2023-08-08 ENCOUNTER — Encounter: Payer: Self-pay | Admitting: Internal Medicine

## 2023-08-08 NOTE — Assessment & Plan Note (Signed)
Discussed. Will notify me when agreeable for colonoscopy.

## 2023-08-08 NOTE — Assessment & Plan Note (Signed)
Last cbc - platelet count wnl.  Follow.

## 2023-08-08 NOTE — Assessment & Plan Note (Signed)
Continue low carb diet and exercise.  Continue jardiance, tresiba and ozmpic.  Sugars doing better. Follow sugars. Follow met b and A1c.

## 2023-08-08 NOTE — Assessment & Plan Note (Signed)
On crestor.  Low cholesterol diet and exercise.  Follow lipid panel and liver function tests.

## 2023-08-08 NOTE — Assessment & Plan Note (Signed)
Overall feels handling things relatively well. Does not feel needs any further intervention. Follow.

## 2023-09-08 ENCOUNTER — Other Ambulatory Visit: Payer: Self-pay | Admitting: Internal Medicine

## 2023-10-26 ENCOUNTER — Other Ambulatory Visit: Payer: Self-pay | Admitting: Internal Medicine

## 2023-10-26 DIAGNOSIS — E7800 Pure hypercholesterolemia, unspecified: Secondary | ICD-10-CM

## 2023-10-29 ENCOUNTER — Other Ambulatory Visit: Payer: Self-pay | Admitting: Internal Medicine

## 2023-12-10 ENCOUNTER — Ambulatory Visit (INDEPENDENT_AMBULATORY_CARE_PROVIDER_SITE_OTHER): Payer: BC Managed Care – PPO | Admitting: Internal Medicine

## 2023-12-10 ENCOUNTER — Encounter: Payer: Self-pay | Admitting: Internal Medicine

## 2023-12-10 VITALS — BP 108/70 | HR 67 | Temp 98.0°F | Resp 16 | Ht 72.0 in | Wt 244.0 lb

## 2023-12-10 DIAGNOSIS — Z7985 Long-term (current) use of injectable non-insulin antidiabetic drugs: Secondary | ICD-10-CM

## 2023-12-10 DIAGNOSIS — M722 Plantar fascial fibromatosis: Secondary | ICD-10-CM | POA: Insufficient documentation

## 2023-12-10 DIAGNOSIS — R002 Palpitations: Secondary | ICD-10-CM | POA: Diagnosis not present

## 2023-12-10 DIAGNOSIS — Z0000 Encounter for general adult medical examination without abnormal findings: Secondary | ICD-10-CM

## 2023-12-10 DIAGNOSIS — F439 Reaction to severe stress, unspecified: Secondary | ICD-10-CM

## 2023-12-10 DIAGNOSIS — Z136 Encounter for screening for cardiovascular disorders: Secondary | ICD-10-CM | POA: Insufficient documentation

## 2023-12-10 DIAGNOSIS — E7800 Pure hypercholesterolemia, unspecified: Secondary | ICD-10-CM | POA: Diagnosis not present

## 2023-12-10 DIAGNOSIS — E1165 Type 2 diabetes mellitus with hyperglycemia: Secondary | ICD-10-CM

## 2023-12-10 DIAGNOSIS — D696 Thrombocytopenia, unspecified: Secondary | ICD-10-CM

## 2023-12-10 DIAGNOSIS — Z7984 Long term (current) use of oral hypoglycemic drugs: Secondary | ICD-10-CM

## 2023-12-10 LAB — BASIC METABOLIC PANEL
BUN: 18 mg/dL (ref 6–23)
CO2: 28 meq/L (ref 19–32)
Calcium: 9.5 mg/dL (ref 8.4–10.5)
Chloride: 104 meq/L (ref 96–112)
Creatinine, Ser: 0.88 mg/dL (ref 0.40–1.50)
GFR: 97.91 mL/min (ref >60.00)
Glucose, Bld: 109 mg/dL — ABNORMAL HIGH (ref 70–99)
Potassium: 4 meq/L (ref 3.5–5.1)
Sodium: 140 meq/L (ref 135–145)

## 2023-12-10 LAB — HEPATIC FUNCTION PANEL
ALT: 22 U/L (ref 0–53)
AST: 18 U/L (ref 0–37)
Albumin: 4.9 g/dL (ref 3.5–5.2)
Alkaline Phosphatase: 59 U/L (ref 39–117)
Bilirubin, Direct: 0.2 mg/dL (ref 0.0–0.3)
Total Bilirubin: 0.7 mg/dL (ref 0.2–1.2)
Total Protein: 7.3 g/dL (ref 6.0–8.3)

## 2023-12-10 LAB — CBC WITH DIFFERENTIAL/PLATELET
Basophils Absolute: 0 10*3/uL (ref 0.0–0.1)
Basophils Relative: 0.4 % (ref 0.0–3.0)
Eosinophils Absolute: 0.1 10*3/uL (ref 0.0–0.7)
Eosinophils Relative: 2.4 % (ref 0.0–5.0)
HCT: 44.4 % (ref 39.0–52.0)
Hemoglobin: 14.9 g/dL (ref 13.0–17.0)
Lymphocytes Relative: 31.2 % (ref 12.0–46.0)
Lymphs Abs: 2 10*3/uL (ref 0.7–4.0)
MCHC: 33.6 g/dL (ref 30.0–36.0)
MCV: 92 fL (ref 78.0–100.0)
Monocytes Absolute: 0.6 10*3/uL (ref 0.1–1.0)
Monocytes Relative: 9.9 % (ref 3.0–12.0)
Neutro Abs: 3.5 10*3/uL (ref 1.4–7.7)
Neutrophils Relative %: 56.1 % (ref 43.0–77.0)
Platelets: 159 10*3/uL (ref 150.0–400.0)
RBC: 4.82 Mil/uL (ref 4.22–5.81)
RDW: 12.9 % (ref 11.5–15.5)
WBC: 6.3 10*3/uL (ref 4.0–10.5)

## 2023-12-10 LAB — LIPID PANEL
Cholesterol: 129 mg/dL (ref 0–200)
HDL: 42.1 mg/dL (ref >39.00)
LDL Cholesterol: 68 mg/dL (ref 0–99)
NonHDL: 86.73
Total CHOL/HDL Ratio: 3
Triglycerides: 92 mg/dL (ref 0.0–149.0)
VLDL: 18.4 mg/dL (ref 0.0–40.0)

## 2023-12-10 LAB — HEMOGLOBIN A1C: Hgb A1c MFr Bld: 6.8 % — ABNORMAL HIGH (ref 4.6–6.5)

## 2023-12-10 MED ORDER — SERTRALINE HCL 25 MG PO TABS: 25.0000 mg | ORAL_TABLET | Freq: Every day | ORAL | 2 refills | Status: DC

## 2023-12-10 NOTE — Assessment & Plan Note (Signed)
Last check wnl.  Has varied. Recheck cbc today.

## 2023-12-10 NOTE — Assessment & Plan Note (Signed)
Physical today 12/10/23. Marland Kitchen  Discussed colonoscopy.  Will notify me when agreeable for colonoscopy.  PSA 08/06/23 - .38.

## 2023-12-10 NOTE — Assessment & Plan Note (Signed)
Reports intermittent episodes of pounding heart as outlined. EKG - SR/SB - no acute ischemic changes. Given persistent intermittent episodes, discussed further w/up.  Will place zio monitor. Check labs including metabolic panel and cbc. Avoids increased caffeine. Discussed possible sleep apnea. Will start with placing monitor first. Further w/up pending results.

## 2023-12-10 NOTE — Assessment & Plan Note (Signed)
Discussed supports, stretches. Improved. Follow.

## 2023-12-10 NOTE — Assessment & Plan Note (Signed)
Discussed obtaining CT calcium score.  Agreeable.

## 2023-12-10 NOTE — Assessment & Plan Note (Signed)
Overall feels handling things relatively well. Does not feel needs any further intervention. Follow.

## 2023-12-10 NOTE — Progress Notes (Signed)
Subjective:    Patient ID: Edward Spencer, male    DOB: 15-Jun-1971, 53 y.o.   MRN: [id number]  Patient here for  Chief Complaint  Patient presents with   Annual Exam    HPI Here for a physical exam. On jardiance, ozempic and tresiba for his diabetes. Had intolerance to metformin. Reports he is doing relatively well. Increased stress.  Overall he feels he is handling things relatively well, but has noticed over the last  6 months - some increased episodes of heart pounding. Notices at night when lies down and sometimes, just when sitting. Worsened with increased anxiety.  Worries a lot. Discussed treatment options. Feels does need something to help level things out. No chest pain.  Breathing stable. No bowel change reported. Also reports having some issues with what he feels was plantar fasciitis. Using supports. Stretches. Pain is better. States am sugars averaging 99-120.    Past Medical History:  Diagnosis Date   Allergy    Arthritis    Asthma    Depression    Diabetes mellitus without complication (HCC)    History of chicken pox    History reviewed. No pertinent surgical history. Family History  Problem Relation Age of Onset   Arthritis Mother    Hyperlipidemia Mother    Arthritis Father    Hypertension Father    Diabetes Father    Mental illness Maternal Aunt    Hyperlipidemia Maternal Aunt    Alcohol abuse Maternal Aunt    Mental illness Paternal Aunt    Alcohol abuse Paternal Aunt    Arthritis Paternal Uncle    Arthritis Maternal Grandmother    Arthritis Paternal Grandmother    Hyperlipidemia Paternal Grandmother    Diabetes Paternal Grandmother    Social History   Socioeconomic History   Marital status: Single    Spouse name: Not on file   Number of children: Not on file   Years of education: Not on file   Highest education level: Associate degree: occupational, Scientist, product/process development, or vocational program  Occupational History   Not on file  Tobacco Use   Smoking  status: Every Day    Current packs/day: 0.25    Average packs/day: 0.3 packs/day for 40.0 years (10.0 ttl pk-yrs)    Types: Cigarettes   Smokeless tobacco: Never   Tobacco comments:    1.5 pack per week- 07/22/2022  Vaping Use   Vaping status: Never Used  Substance and Sexual Activity   Alcohol use: Yes    Alcohol/week: 0.0 standard drinks of alcohol    Comment: social   Drug use: No   Sexual activity: Not on file  Other Topics Concern   Not on file  Social History Narrative   Not on file   Social Drivers of Health   Financial Resource Strain: Low Risk  (12/06/2023)   Overall Financial Resource Strain (CARDIA)    Difficulty of Paying Living Expenses: Not very hard  Food Insecurity: No Food Insecurity (12/06/2023)   Hunger Vital Sign    Worried About Running Out of Food in the Last Year: Never true    Ran Out of Food in the Last Year: Never true  Transportation Needs: No Transportation Needs (12/06/2023)   PRAPARE - Administrator, Civil Service (Medical): No    Lack of Transportation (Non-Medical): No  Physical Activity: Sufficiently Active (12/06/2023)   Exercise Vital Sign    Days of Exercise per Week: 4 days    Minutes of Exercise  per Session: 40 min  Stress: Stress Concern Present (12/06/2023)   [employer] of Occupational Health - Occupational Stress Questionnaire    Feeling of Stress : To some extent  Social Connections: Socially Integrated (12/06/2023)   Social Connection and Isolation Panel [NHANES]    Frequency of Communication with Friends and Family: More than three times a week    Frequency of Social Gatherings with Friends and Family: Twice a week    Attends Religious Services: 1 to 4 times per year    Active Member of [employer] or Organizations: Yes    Attends Engineer, structural: More than 4 times per year    Marital Status: Living with partner     Review of Systems  Constitutional:  Negative for appetite change and unexpected weight  change.  HENT:  Negative for congestion, sinus pressure and sore throat.   Eyes:  Negative for pain and visual disturbance.  Respiratory:  Negative for cough, chest tightness and shortness of breath.   Cardiovascular:  Negative for chest pain and leg swelling.       Heart pounding as outlined.   Gastrointestinal:  Negative for abdominal pain, diarrhea, nausea and vomiting.  Genitourinary:  Negative for difficulty urinating and dysuria.  Musculoskeletal:  Negative for joint swelling and myalgias.       Heel pain as outlined.   Skin:  Negative for color change and rash.  Neurological:  Negative for dizziness and headaches.  Hematological:  Negative for adenopathy. Does not bruise/bleed easily.  Psychiatric/Behavioral:  Negative for agitation and dysphoric mood.        Objective:     BP 108/70   Pulse 67   Temp 98 F (36.7 C)   Resp 16   Ht 6' (1.829 m)   Wt 244 lb (110.7 kg)   SpO2 98%   BMI 33.09 kg/m  Wt Readings from Last 3 Encounters:  12/10/23 244 lb (110.7 kg)  08/06/23 240 lb 9.6 oz (109.1 kg)  04/30/23 236 lb 12.8 oz (107.4 kg)    Physical Exam Constitutional:      General: He is not in acute distress.    Appearance: Normal appearance. He is well-developed.  HENT:     Head: Normocephalic and atraumatic.     Right Ear: External ear normal.     Left Ear: External ear normal.     Mouth/Throat:     Pharynx: No oropharyngeal exudate or posterior oropharyngeal erythema.  Eyes:     General: No scleral icterus.       Right eye: No discharge.        Left eye: No discharge.     Conjunctiva/sclera: Conjunctivae normal.  Neck:     Thyroid: No thyromegaly.  Cardiovascular:     Rate and Rhythm: Normal rate and regular rhythm.  Pulmonary:     Effort: No respiratory distress.     Breath sounds: Normal breath sounds. No wheezing.  Abdominal:     General: Bowel sounds are normal.     Palpations: Abdomen is soft.     Tenderness: There is no abdominal tenderness.   Musculoskeletal:        General: No swelling or tenderness.     Cervical back: Neck supple. No tenderness.  Lymphadenopathy:     Cervical: No cervical adenopathy.  Skin:    Findings: No erythema or rash.  Neurological:     Mental Status: He is alert and oriented to person, place, and time.  Psychiatric:  Mood and Affect: Mood normal.        Behavior: Behavior normal.         Outpatient Encounter Medications as of 12/10/2023  Medication Sig   empagliflozin (JARDIANCE) 25 MG TABS tablet Take 1 tablet (25 mg total) by mouth daily before breakfast.   loratadine (CLARITIN) 10 MG tablet Take 10 mg by mouth daily.   Multiple Vitamin (MULTIVITAMIN) tablet Take 1 tablet by mouth daily.   OZEMPIC, 0.25 OR 0.5 MG/DOSE, 2 MG/3ML SOPN INJECT 0.5MG  INTO THE SKIN ONCE A WEEK   rosuvastatin (CRESTOR) 40 MG tablet TAKE 1 TABLET(40 MG) BY MOUTH DAILY   sertraline (ZOLOFT) 25 MG tablet Take 1 tablet (25 mg total) by mouth daily.   TRESIBA FLEXTOUCH 100 UNIT/ML FlexTouch Pen ADMINISTER 8 UNITS UNDER THE SKIN DAILY   vitamin B-12 (CYANOCOBALAMIN) 100 MCG tablet Take 100 mcg by mouth daily.   No facility-administered encounter medications on file as of 12/10/2023.     Lab Results  Component Value Date   WBC 6.3 12/10/2023   HGB 14.9 12/10/2023   HCT 44.4 12/10/2023   PLT 159.0 12/10/2023   GLUCOSE 109 (H) 12/10/2023   CHOL 129 12/10/2023   TRIG 92.0 12/10/2023   HDL 42.10 12/10/2023   LDLDIRECT 58.0 03/27/2022   LDLCALC 68 12/10/2023   ALT 22 12/10/2023   AST 18 12/10/2023   NA 140 12/10/2023   K 4.0 12/10/2023   CL 104 12/10/2023   CREATININE 0.88 12/10/2023   BUN 18 12/10/2023   CO2 28 12/10/2023   TSH 1.15 08/06/2023   PSA 0.38 08/06/2023   HGBA1C 6.8 (H) 12/10/2023   MICROALBUR <0.7 04/30/2023       Assessment & Plan:  Routine general medical examination at a health care facility  Health care maintenance Assessment & Plan: Physical today 12/10/23. Marland Kitchen  Discussed  colonoscopy.  Will notify me when agreeable for colonoscopy.  PSA 08/06/23 - .38.    Type 2 diabetes mellitus with hyperglycemia, without long-term current use of insulin (HCC) Assessment & Plan: Continue low carb diet and exercise.  Continue jardiance, tresiba and ozmpic.  Sugars doing better. Follow sugars. Follow met b and A1c.   Orders: -     Basic metabolic panel -     Hemoglobin A1c  Hypercholesterolemia Assessment & Plan: On crestor.  Low cholesterol diet and exercise.  Follow lipid panel and liver function tests.    Orders: -     Hepatic function panel -     Lipid panel  Thrombocytopenia (HCC) Assessment & Plan: Last check wnl.  Has varied. Recheck cbc today.   Orders: -     CBC with Differential/Platelet  Pounding heartbeat Assessment & Plan: Reports intermittent episodes of pounding heart as outlined. EKG - SR/SB - no acute ischemic changes. Given persistent intermittent episodes, discussed further w/up.  Will place zio monitor. Check labs including metabolic panel and cbc. Avoids increased caffeine. Discussed possible sleep apnea. Will start with placing monitor first. Further w/up pending results.   Orders: -     EKG 12-Lead -     LONG TERM MONITOR (3-14 DAYS); Future  Encounter for screening for coronary artery disease Assessment & Plan: Discussed obtaining CT calcium score.  Agreeable.   Orders: -     CT CARDIAC SCORING (SELF PAY ONLY); Future  Stress Assessment & Plan: Overall feels handling things relatively well. Does not feel needs any further intervention. Follow.    Plantar fasciitis Assessment & Plan: Discussed supports,  stretches. Improved. Follow.    Other orders -     Sertraline HCl; Take 1 tablet (25 mg total) by mouth daily.  Dispense: 30 tablet; Refill: 2     Dale [place], MD

## 2023-12-10 NOTE — Assessment & Plan Note (Signed)
On crestor.  Low cholesterol diet and exercise.  Follow lipid panel and liver function tests.

## 2023-12-10 NOTE — Assessment & Plan Note (Signed)
Continue low carb diet and exercise.  Continue jardiance, tresiba and ozmpic.  Sugars doing better. Follow sugars. Follow met b and A1c.

## 2023-12-17 ENCOUNTER — Ambulatory Visit (HOSPITAL_COMMUNITY)
Admission: RE | Admit: 2023-12-17 | Discharge: 2023-12-17 | Disposition: A | Discharge status: Home / Self Care | Payer: Self-pay | Source: Ambulatory Visit | Attending: Internal Medicine | Admitting: Internal Medicine

## 2023-12-17 DIAGNOSIS — Z136 Encounter for screening for cardiovascular disorders: Secondary | ICD-10-CM | POA: Insufficient documentation

## 2024-01-06 ENCOUNTER — Other Ambulatory Visit: Payer: Self-pay | Admitting: Internal Medicine

## 2024-01-06 ENCOUNTER — Encounter: Payer: Self-pay | Admitting: Internal Medicine

## 2024-02-21 ENCOUNTER — Ambulatory Visit (INDEPENDENT_AMBULATORY_CARE_PROVIDER_SITE_OTHER): Payer: BC Managed Care – PPO | Admitting: Internal Medicine

## 2024-02-21 DIAGNOSIS — R002 Palpitations: Secondary | ICD-10-CM

## 2024-02-21 NOTE — Progress Notes (Unsigned)
 Subjective:    Patient ID: Edward Spencer, male    DOB: 03-09-1971, 53 y.o.   MRN: [id number]  Patient here for No chief complaint on file.   HPI Here for a scheduled follow up - follow up regarding diabetes. Currently on jardiance, tresiba and ozempic. Last visit, he had reported increased heart pounding. Zio monitor ordered. No results. Did have calcium score - 15.6. recommended continuing statin and start ECASA 81mg  q day.    Past Medical History:  Diagnosis Date   Allergy    Arthritis    Asthma    Depression    Diabetes mellitus without complication (HCC)    History of chicken pox    No past surgical history on file. Family History  Problem Relation Age of Onset   Arthritis Mother    Hyperlipidemia Mother    Arthritis Father    Hypertension Father    Diabetes Father    Mental illness Maternal Aunt    Hyperlipidemia Maternal Aunt    Alcohol abuse Maternal Aunt    Mental illness Paternal Aunt    Alcohol abuse Paternal Aunt    Arthritis Paternal Uncle    Arthritis Maternal Grandmother    Arthritis Paternal Grandmother    Hyperlipidemia Paternal Grandmother    Diabetes Paternal Grandmother    Social History   Socioeconomic History   Marital status: Single    Spouse name: Not on file   Number of children: Not on file   Years of education: Not on file   Highest education level: Associate degree: occupational, Scientist, product/process development, or vocational program  Occupational History   Not on file  Tobacco Use   Smoking status: Every Day    Current packs/day: 0.25    Average packs/day: 0.3 packs/day for 40.0 years (10.0 ttl pk-yrs)    Types: Cigarettes   Smokeless tobacco: Never   Tobacco comments:    1.5 pack per week- 07/22/2022  Vaping Use   Vaping status: Never Used  Substance and Sexual Activity   Alcohol use: Yes    Alcohol/week: 0.0 standard drinks of alcohol    Comment: social   Drug use: No   Sexual activity: Not on file  Other Topics Concern   Not on file  Social  History Narrative   Not on file   Social Drivers of Health   Financial Resource Strain: Low Risk  (12/06/2023)   Overall Financial Resource Strain (CARDIA)    Difficulty of Paying Living Expenses: Not very hard  Food Insecurity: No Food Insecurity (12/06/2023)   Hunger Vital Sign    Worried About Running Out of Food in the Last Year: Never true    Ran Out of Food in the Last Year: Never true  Transportation Needs: No Transportation Needs (12/06/2023)   PRAPARE - Administrator, Civil Service (Medical): No    Lack of Transportation (Non-Medical): No  Physical Activity: Sufficiently Active (12/06/2023)   Exercise Vital Sign    Days of Exercise per Week: 4 days    Minutes of Exercise per Session: 40 min  Stress: Stress Concern Present (12/06/2023)   [employer] of Occupational Health - Occupational Stress Questionnaire    Feeling of Stress : To some extent  Social Connections: Socially Integrated (12/06/2023)   Social Connection and Isolation Panel [NHANES]    Frequency of Communication with Friends and Family: More than three times a week    Frequency of Social Gatherings with Friends and Family: Twice a week  Attends Religious Services: 1 to 4 times per year    Active Member of Clubs or Organizations: Yes    Attends Banker Meetings: More than 4 times per year    Marital Status: Living with partner     Review of Systems     Objective:     There were no vitals taken for this visit. Wt Readings from Last 3 Encounters:  12/10/23 244 lb (110.7 kg)  08/06/23 240 lb 9.6 oz (109.1 kg)  04/30/23 236 lb 12.8 oz (107.4 kg)    Physical Exam  {Perform Simple Foot Exam  Perform Detailed exam:1} {Insert foot Exam (Optional):30965}   Outpatient Encounter Medications as of 02/21/2024  Medication Sig   empagliflozin (JARDIANCE) 25 MG TABS tablet Take 1 tablet (25 mg total) by mouth daily before breakfast.   loratadine (CLARITIN) 10 MG tablet Take 10 mg by  mouth daily.   Multiple Vitamin (MULTIVITAMIN) tablet Take 1 tablet by mouth daily.   OZEMPIC, 0.25 OR 0.5 MG/DOSE, 2 MG/3ML SOPN INJECT 0.5 MG SUBCUTANEOUS ONCE WEEKLY   rosuvastatin (CRESTOR) 40 MG tablet TAKE 1 TABLET(40 MG) BY MOUTH DAILY   sertraline (ZOLOFT) 25 MG tablet Take 1 tablet (25 mg total) by mouth daily.   TRESIBA FLEXTOUCH 100 UNIT/ML FlexTouch Pen ADMINISTER 8 UNITS UNDER THE SKIN DAILY   vitamin B-12 (CYANOCOBALAMIN) 100 MCG tablet Take 100 mcg by mouth daily.   No facility-administered encounter medications on file as of 02/21/2024.     Lab Results  Component Value Date   WBC 6.3 12/10/2023   HGB 14.9 12/10/2023   HCT 44.4 12/10/2023   PLT 159.0 12/10/2023   GLUCOSE 109 (H) 12/10/2023   CHOL 129 12/10/2023   TRIG 92.0 12/10/2023   HDL 42.10 12/10/2023   LDLDIRECT 58.0 03/27/2022   LDLCALC 68 12/10/2023   ALT 22 12/10/2023   AST 18 12/10/2023   NA 140 12/10/2023   K 4.0 12/10/2023   CL 104 12/10/2023   CREATININE 0.88 12/10/2023   BUN 18 12/10/2023   CO2 28 12/10/2023   TSH 1.15 08/06/2023   PSA 0.38 08/06/2023   HGBA1C 6.8 (H) 12/10/2023   MICROALBUR <0.7 04/30/2023    CT CARDIAC SCORING (SELF PAY ONLY) Addendum Date: 12/23/2023 ADDENDUM REPORT: 12/23/2023 16:12 EXAM: OVER-READ INTERPRETATION CT CHEST The following report is an over-read performed by radiologist Dr. Romona Curls of [facility] on 12/23/2023. This over-read does not include interpretation of cardiac or coronary anatomy or pathology. The coronary calcium score interpretation by the cardiologist is attached. COMPARISON:  None. FINDINGS: Cardiovascular: No significant extracardiac vascular findings. Normal heart size. No pericardial effusion. Mediastinum/Nodes: No enlarged mediastinal lymph nodes. The visible trachea and esophagus demonstrate no significant findings. Lungs/Pleura: The visible lungs are clear. No pleural effusion. Upper Abdomen: No acute abnormality. Musculoskeletal: No  chest wall mass or suspicious bone lesions identified. IMPRESSION: No significant extracardiac findings. Electronically Signed   By: Romona Curls M.D.   On: 12/23/2023 16:12   Result Date: 12/23/2023 CLINICAL DATA:  Cardiovascular Disease Risk stratification EXAM: Coronary Calcium Score TECHNIQUE: A gated, non-contrast computed tomography scan of the heart was performed using 3mm slice thickness. Axial images were analyzed on a dedicated workstation. Calcium scoring of the coronary arteries was performed using the Agatston method. FINDINGS: Coronary Calcium Score: Left main: 0 Left anterior descending artery: 15.6 Left circumflex artery: 0 Right coronary artery: 0 Total: 15.6 Percentile: 63rd Pericardium: Normal. Non-cardiac: See separate report from [facility]. IMPRESSION: 1. Coronary  calcium score of 15.6. This was 63rd percentile for age-, race-, and sex-matched controls. RECOMMENDATIONS: Coronary artery calcium (CAC) score is a strong predictor of incident coronary heart disease (CHD) and provides predictive information beyond traditional risk factors. CAC scoring is reasonable to use in the decision to withhold, postpone, or initiate statin therapy in intermediate-risk or selected borderline-risk asymptomatic adults (age 42-75 years and LDL-C >=70 to <190 mg/dL) who do not have diabetes or established atherosclerotic cardiovascular disease (ASCVD).* In intermediate-risk (10-year ASCVD risk >=7.5% to <20%) adults or selected borderline-risk (10-year ASCVD risk >=5% to <7.5%) adults in whom a CAC score is measured for the purpose of making a treatment decision the following recommendations have been made: If CAC=0, it is reasonable to withhold statin therapy and reassess in 5 to 10 years, as long as higher risk conditions are absent (diabetes mellitus, family history of premature CHD in first degree relatives (males <55 years; females <65 years), cigarette smoking, or LDL >=190 mg/dL). If CAC is 1 to  99, it is reasonable to initiate statin therapy for patients >=54 years of age. If CAC is >=100 or >=75th percentile, it is reasonable to initiate statin therapy at any age. Cardiology referral should be considered for patients with CAC scores >=400 or >=75th percentile. *2018 AHA/ACC/AACVPR/AAPA/ABC/ACPM/ADA/AGS/APhA/ASPC/NLA/PCNA Guideline on the Management of Blood Cholesterol: A Report of the American College of Cardiology/American Heart Association Task Force on Clinical Practice Guidelines. J Am Coll Cardiol. 2019;73(24):3168-3209. Lennie Odor, MD Electronically Signed: By: Lennie Odor M.D. On: 12/17/2023 17:57       Assessment & Plan:  There are no diagnoses linked to this encounter.   Dale [place], MD

## 2024-02-22 ENCOUNTER — Encounter: Payer: Self-pay | Admitting: Internal Medicine

## 2024-02-22 NOTE — Progress Notes (Signed)
 Patient ID: Edward Spencer, male   DOB: 08/01/1971, 53 y.o.   MRN: [id number] Rescheduled appt

## 2024-04-01 ENCOUNTER — Other Ambulatory Visit: Payer: Self-pay | Admitting: Internal Medicine

## 2024-04-04 ENCOUNTER — Other Ambulatory Visit: Payer: Self-pay | Admitting: Internal Medicine

## 2024-04-11 ENCOUNTER — Telehealth: Payer: Self-pay

## 2024-04-11 ENCOUNTER — Other Ambulatory Visit (HOSPITAL_COMMUNITY): Payer: Self-pay

## 2024-04-11 NOTE — Telephone Encounter (Signed)
 Noted

## 2024-04-11 NOTE — Telephone Encounter (Signed)
 Pharmacy Patient Advocate Encounter   Received notification from CoverMyMeds that prior authorization for Ozempic  2 is required/requested.   Insurance verification completed.   The patient is insured through [employer] .   Per test claim: Refill too soon. PA is not needed at this time. Medication was filled 04/03/24. Next eligible fill date is 04/24/24.

## 2024-04-14 ENCOUNTER — Telehealth: Payer: Self-pay | Admitting: Internal Medicine

## 2024-04-14 NOTE — Telephone Encounter (Signed)
 Unable to leave message, vb full. MyChart note sent.Dr Geralyn Knee will not be in the office on June 20th. Please call and reschedule your appointment. E2C2 please reschedule appointment.

## 2024-04-20 ENCOUNTER — Other Ambulatory Visit: Payer: Self-pay | Admitting: Internal Medicine

## 2024-05-05 ENCOUNTER — Ambulatory Visit: Admitting: Internal Medicine

## 2024-05-17 NOTE — Addendum Note (Signed)
 Addended by: Ascencion Coye on: 05/17/2024 11:28 AM   Modules accepted: Orders

## 2024-05-26 IMAGING — DX DG KNEE 1-2V*R*
2 series · 2 of 2 positions shown · non-contrast
Comparison: None Available.

CLINICAL DATA: Knee pain and swelling.

EXAM:
RIGHT KNEE - 1-2 VIEW

[knee ap]
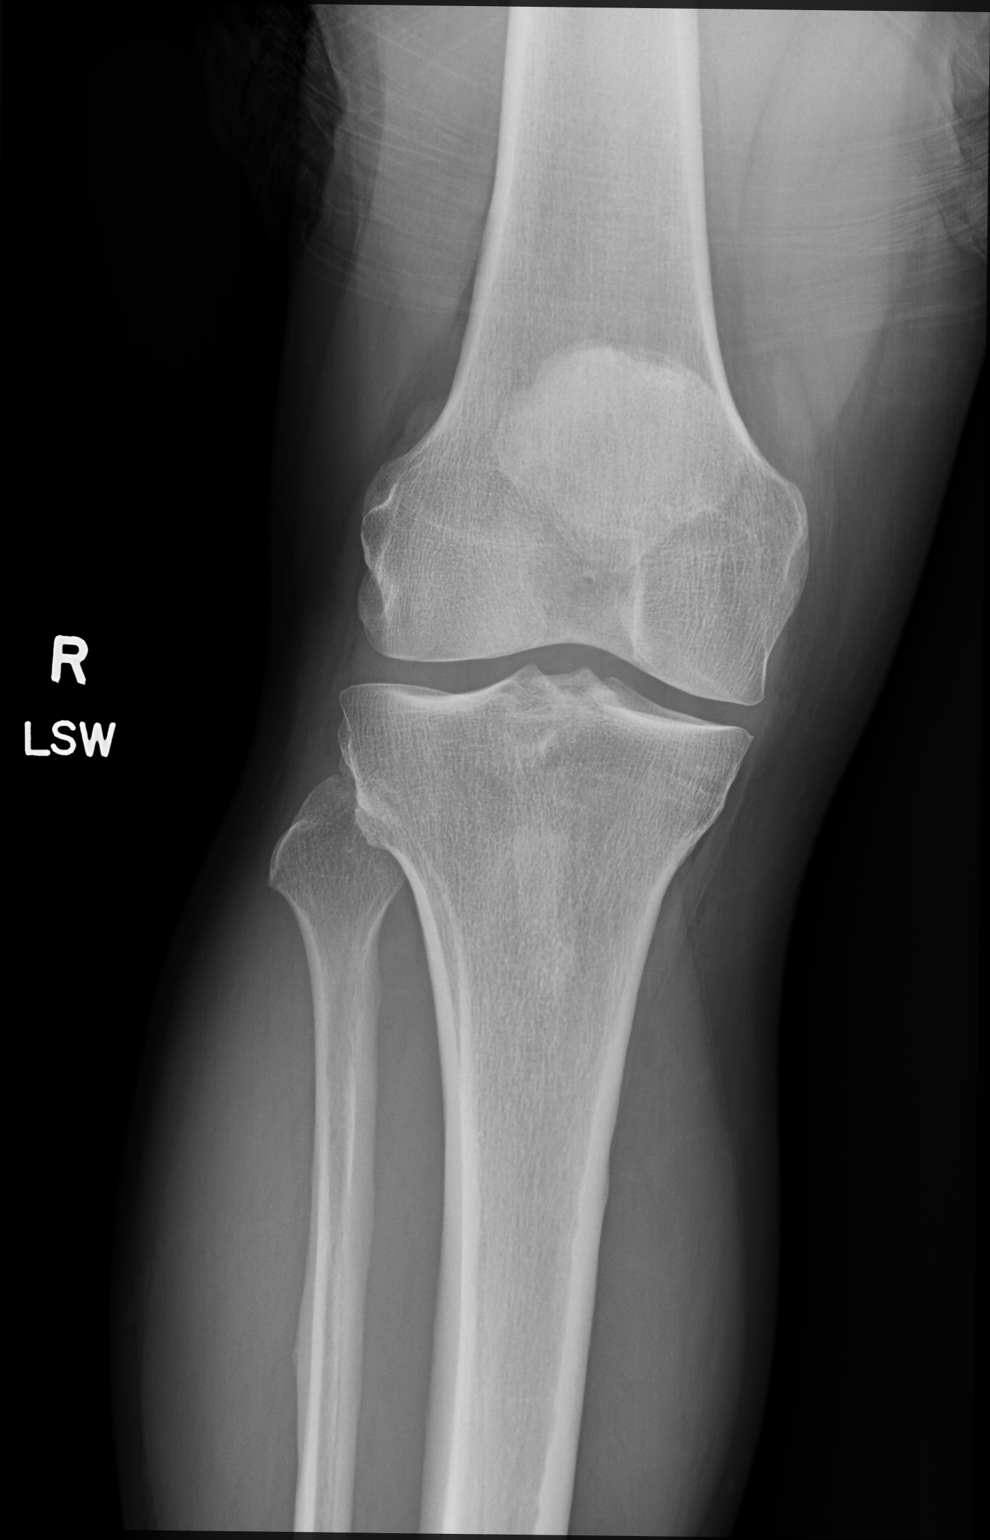

[knee lat]
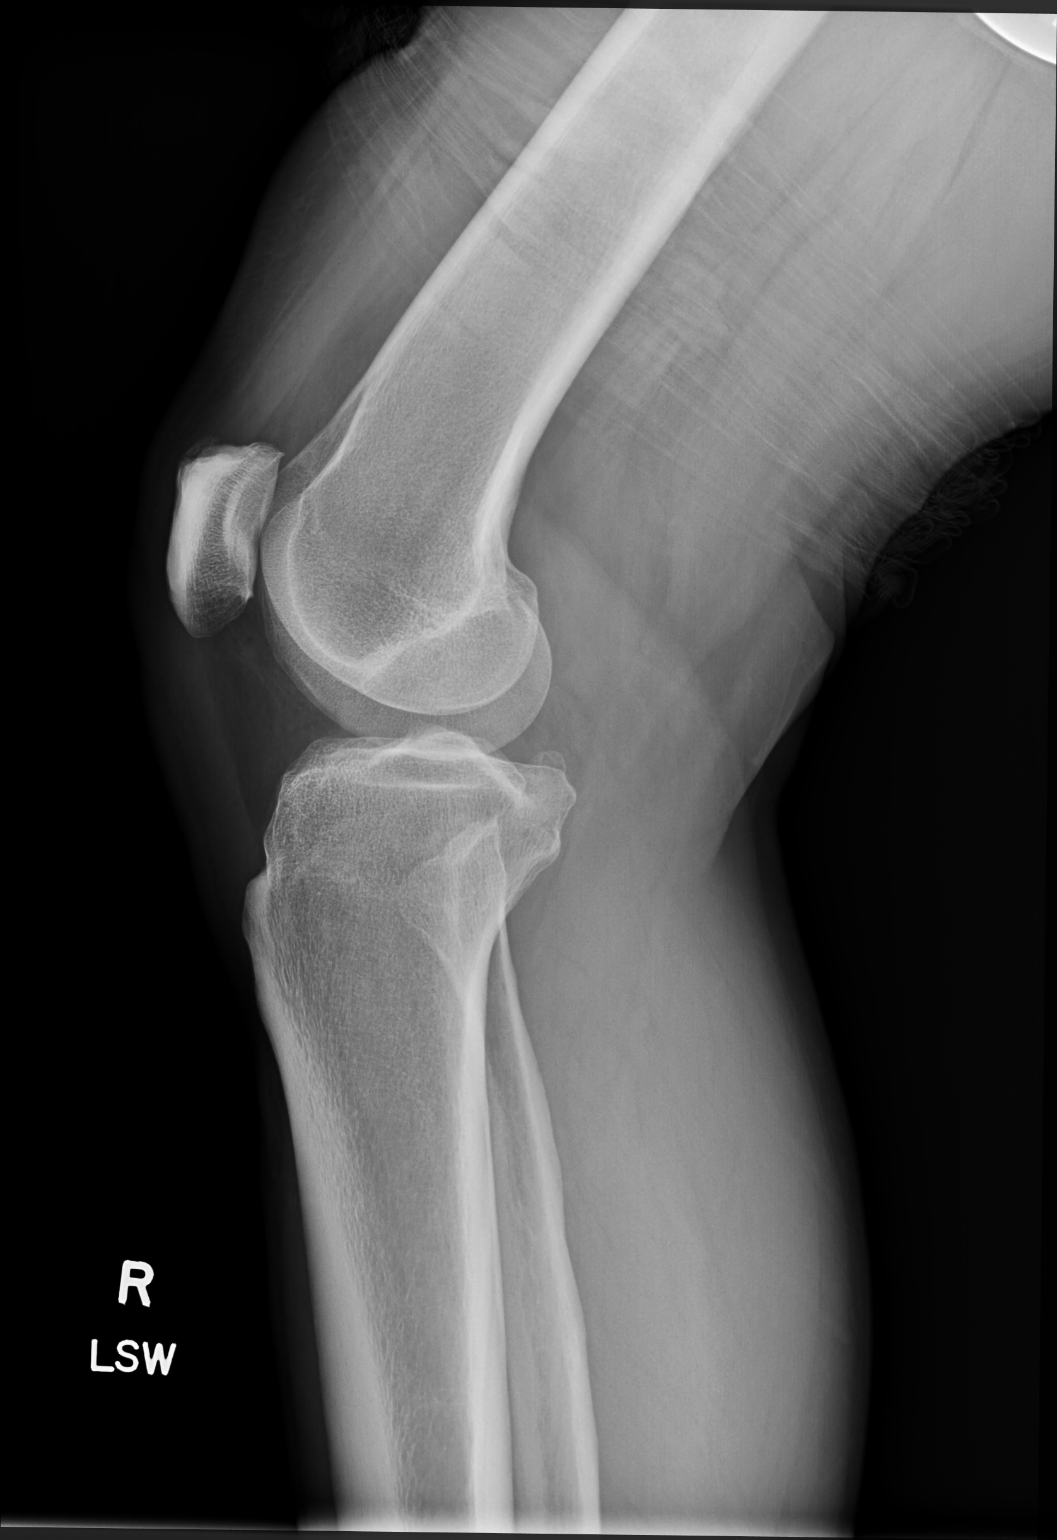

[2 of 2 positions shown; findings below may reference images not displayed]

FINDINGS: Joint spaces are preserved. Minimal peripheral medial compartment
and patellar degenerative osteophytes. No joint effusion. No acute
fracture or dislocation.
IMPRESSION: 1. Minimal osteoarthritis.
2. No acute fracture.

## 2024-06-03 ENCOUNTER — Other Ambulatory Visit: Payer: Self-pay | Admitting: Internal Medicine

## 2024-07-02 ENCOUNTER — Other Ambulatory Visit: Payer: Self-pay | Admitting: Internal Medicine

## 2024-07-21 ENCOUNTER — Other Ambulatory Visit: Payer: Self-pay | Admitting: Internal Medicine

## 2024-07-21 DIAGNOSIS — E7800 Pure hypercholesterolemia, unspecified: Secondary | ICD-10-CM

## 2024-07-26 ENCOUNTER — Other Ambulatory Visit (HOSPITAL_COMMUNITY): Payer: Self-pay

## 2024-07-31 ENCOUNTER — Telehealth: Payer: Self-pay

## 2024-07-31 ENCOUNTER — Other Ambulatory Visit (HOSPITAL_COMMUNITY): Payer: Self-pay

## 2024-07-31 NOTE — Telephone Encounter (Signed)
 Pharmacy Patient Advocate Encounter   Received notification from CoverMyMeds that prior authorization for {Ozempic  (0.25 or 0.5 MG/DOSE) 2MG /3ML pen-injectors required/requested.   Insurance verification completed.   The patient is insured through [facility] .   Per test claim: PA required; PA submitted to above mentioned insurance via Latent Key/confirmation #/EOC [id number] Status is pending

## 2024-08-01 NOTE — Telephone Encounter (Signed)
 Patient is aware. Follow up appt with Dr Glendia scheduled per pt request.

## 2024-08-01 NOTE — Telephone Encounter (Signed)
 Pharmacy Patient Advocate Encounter  Received notification from [facility] that Prior Authorization for Ozempic  (0.25 or 0.5 MG/DOSE) 2MG /3ML pen-injectors  has been APPROVED from 07/31/2024 to 07/31/2025   PA #/Case ID/Reference #: [id number]

## 2024-08-03 ENCOUNTER — Other Ambulatory Visit: Payer: Self-pay | Admitting: Internal Medicine

## 2024-08-04 LAB — HM DIABETES EYE EXAM

## 2024-08-29 ENCOUNTER — Encounter: Payer: Self-pay | Admitting: Internal Medicine

## 2024-08-29 ENCOUNTER — Ambulatory Visit: Admitting: Internal Medicine

## 2024-08-29 ENCOUNTER — Telehealth: Payer: Self-pay

## 2024-08-29 ENCOUNTER — Other Ambulatory Visit (HOSPITAL_COMMUNITY): Payer: Self-pay

## 2024-08-29 VITALS — BP 120/80 | HR 73 | Temp 98.1°F | Resp 17 | Ht 72.0 in | Wt 241.5 lb

## 2024-08-29 DIAGNOSIS — Z7984 Long term (current) use of oral hypoglycemic drugs: Secondary | ICD-10-CM

## 2024-08-29 DIAGNOSIS — E7800 Pure hypercholesterolemia, unspecified: Secondary | ICD-10-CM

## 2024-08-29 DIAGNOSIS — Z125 Encounter for screening for malignant neoplasm of prostate: Secondary | ICD-10-CM

## 2024-08-29 DIAGNOSIS — F439 Reaction to severe stress, unspecified: Secondary | ICD-10-CM

## 2024-08-29 DIAGNOSIS — D696 Thrombocytopenia, unspecified: Secondary | ICD-10-CM

## 2024-08-29 DIAGNOSIS — Z1211 Encounter for screening for malignant neoplasm of colon: Secondary | ICD-10-CM

## 2024-08-29 DIAGNOSIS — Z7985 Long-term (current) use of injectable non-insulin antidiabetic drugs: Secondary | ICD-10-CM

## 2024-08-29 DIAGNOSIS — E1165 Type 2 diabetes mellitus with hyperglycemia: Secondary | ICD-10-CM | POA: Diagnosis not present

## 2024-08-29 NOTE — Telephone Encounter (Signed)
 Pharmacy Patient Advocate Encounter   Received notification from Onbase that prior authorization for Jardiance  25MG  tablets  is required/requested.   Insurance verification completed.   The patient is insured through [facility].   Per test claim: PA required; PA submitted to above mentioned insurance via Latent Key/confirmation #/EOC [id number] Status is pending

## 2024-08-29 NOTE — Telephone Encounter (Signed)
PA needed for Jardiance

## 2024-08-29 NOTE — Progress Notes (Signed)
 Subjective:    Patient ID: Edward Spencer, male    DOB: [birth year], 53 y.o.   MRN: [id number]  Patient here for  Chief Complaint  Patient presents with   Medication Refill    Med refill & labs    HPI Here for a scheduled follow up - follow up regarding diabetes. Have not seen him since 12/10/23. Continues on jardiance , ozempic  and tresiba . Calcium  score 12/17/23 - 15.6. recommended aspirin and statin daily. Reports increased stress. Work stress. Discussed. Overall appears to be handling things relatively well. Stays active. No chest pain or sob reported. No abdominal pain or bowel change reported. States am sugars averaging 120-125-150.    Past Medical History:  Diagnosis Date   Allergy    Arthritis    Asthma    Depression    Diabetes mellitus without complication (HCC)    History of chicken pox    History reviewed. No pertinent surgical history. Family History  Problem Relation Age of Onset   Arthritis Mother    Hyperlipidemia Mother    Arthritis Father    Hypertension Father    Diabetes Father    Mental illness Maternal Aunt    Hyperlipidemia Maternal Aunt    Alcohol abuse Maternal Aunt    Mental illness Paternal Aunt    Alcohol abuse Paternal Aunt    Arthritis Paternal Uncle    Arthritis Maternal Grandmother    Arthritis Paternal Grandmother    Hyperlipidemia Paternal Grandmother    Diabetes Paternal Grandmother    Social History   Socioeconomic History   Marital status: Single    Spouse name: Not on file   Number of children: Not on file   Years of education: Not on file   Highest education level: Associate degree: occupational, Scientist, product/process development, or vocational program  Occupational History   Not on file  Tobacco Use   Smoking status: Every Day    Current packs/day: 0.25    Average packs/day: 0.3 packs/day for 40.0 years (10.0 ttl pk-yrs)    Types: Cigarettes   Smokeless tobacco: Never   Tobacco comments:    1.5 pack per week- 07/22/2022  Vaping Use   Vaping  status: Never Used  Substance and Sexual Activity   Alcohol use: Yes    Alcohol/week: 0.0 standard drinks of alcohol    Comment: social   Drug use: No   Sexual activity: Not on file  Other Topics Concern   Not on file  Social History Narrative   Not on file   Social Drivers of Health   Financial Resource Strain: Low Risk  (12/06/2023)   Overall Financial Resource Strain (CARDIA)    Difficulty of Paying Living Expenses: Not very hard  Food Insecurity: No Food Insecurity (12/06/2023)   Hunger Vital Sign    Worried About Running Out of Food in the Last Year: Never true    Ran Out of Food in the Last Year: Never true  Transportation Needs: No Transportation Needs (12/06/2023)   PRAPARE - Administrator, Civil Service (Medical): No    Lack of Transportation (Non-Medical): No  Physical Activity: Sufficiently Active (12/06/2023)   Exercise Vital Sign    Days of Exercise per Week: 4 days    Minutes of Exercise per Session: 40 min  Stress: Stress Concern Present (12/06/2023)   [employer] of Occupational Health - Occupational Stress Questionnaire    Feeling of Stress : To some extent  Social Connections: Socially Integrated (12/06/2023)   Social Connection  and Isolation Panel    Frequency of Communication with Friends and Family: More than three times a week    Frequency of Social Gatherings with Friends and Family: Twice a week    Attends Religious Services: 1 to 4 times per year    Active Member of [employer] or Organizations: Yes    Attends Engineer, structural: More than 4 times per year    Marital Status: Living with partner     Review of Systems  Constitutional:  Negative for appetite change and unexpected weight change.  HENT:  Negative for congestion and sinus pressure.   Respiratory:  Negative for cough, chest tightness and shortness of breath.   Cardiovascular:  Negative for chest pain, palpitations and leg swelling.  Gastrointestinal:  Negative for  abdominal pain, diarrhea, nausea and vomiting.  Genitourinary:  Negative for difficulty urinating and enuresis.  Musculoskeletal:  Negative for joint swelling and myalgias.  Skin:  Negative for color change and rash.  Neurological:  Negative for dizziness and headaches.  Psychiatric/Behavioral:  Negative for agitation and dysphoric mood.        Objective:     BP 120/80 (Cuff Size: Normal)   Pulse 73   Temp 98.1 F (36.7 C) (Oral)   Resp 17   Ht 6' (1.829 m)   Wt 241 lb 8 oz (109.5 kg)   SpO2 98%   BMI 32.75 kg/m  Wt Readings from Last 3 Encounters:  08/29/24 241 lb 8 oz (109.5 kg)  12/10/23 244 lb (110.7 kg)  08/06/23 240 lb 9.6 oz (109.1 kg)    Physical Exam Vitals reviewed.  Constitutional:      General: He is not in acute distress.    Appearance: Normal appearance. He is well-developed.  HENT:     Head: Normocephalic and atraumatic.     Right Ear: External ear normal.     Left Ear: External ear normal.     Mouth/Throat:     Pharynx: No oropharyngeal exudate or posterior oropharyngeal erythema.  Eyes:     General: No scleral icterus.       Right eye: No discharge.        Left eye: No discharge.     Conjunctiva/sclera: Conjunctivae normal.  Cardiovascular:     Rate and Rhythm: Normal rate and regular rhythm.  Pulmonary:     Effort: Pulmonary effort is normal. No respiratory distress.     Breath sounds: Normal breath sounds.  Abdominal:     General: Bowel sounds are normal.     Palpations: Abdomen is soft.     Tenderness: There is no abdominal tenderness.  Musculoskeletal:        General: No swelling or tenderness.     Cervical back: Neck supple. No tenderness.  Lymphadenopathy:     Cervical: No cervical adenopathy.  Skin:    Findings: No erythema or rash.  Neurological:     Mental Status: He is alert.  Psychiatric:        Mood and Affect: Mood normal.        Behavior: Behavior normal.         Outpatient Encounter Medications as of 08/29/2024   Medication Sig   JARDIANCE  25 MG TABS tablet TAKE 1 TABLET(25 MG) BY MOUTH DAILY BEFORE BREAKFAST   loratadine (CLARITIN) 10 MG tablet Take 10 mg by mouth daily.   Multiple Vitamin (MULTIVITAMIN) tablet Take 1 tablet by mouth daily.   OZEMPIC , 0.25 OR 0.5 MG/DOSE, 2 MG/3ML SOPN INJECT 0.5  MG SUBCUTANEOUS ONCE WEEKLY   rosuvastatin  (CRESTOR ) 40 MG tablet TAKE 1 TABLET(40 MG) BY MOUTH DAILY   sertraline  (ZOLOFT ) 25 MG tablet TAKE 1 TABLET(25 MG) BY MOUTH DAILY   TRESIBA  FLEXTOUCH 100 UNIT/ML FlexTouch Pen ADMINISTER 8 UNITS UNDER THE SKIN DAILY   vitamin B-12 (CYANOCOBALAMIN) 100 MCG tablet Take 100 mcg by mouth daily.   No facility-administered encounter medications on file as of 08/29/2024.     Lab Results  Component Value Date   WBC 6.3 12/10/2023   HGB 14.9 12/10/2023   HCT 44.4 12/10/2023   PLT 159.0 12/10/2023   GLUCOSE 109 (H) 12/10/2023   CHOL 129 12/10/2023   TRIG 92.0 12/10/2023   HDL 42.10 12/10/2023   LDLDIRECT 58.0 03/27/2022   LDLCALC 68 12/10/2023   ALT 22 12/10/2023   AST 18 12/10/2023   NA 140 12/10/2023   K 4.0 12/10/2023   CL 104 12/10/2023   CREATININE 0.88 12/10/2023   BUN 18 12/10/2023   CO2 28 12/10/2023   TSH 1.15 08/06/2023   PSA 0.38 08/06/2023   HGBA1C 6.8 (H) 12/10/2023   MICROALBUR 0.9 08/09/2015    CT CARDIAC SCORING (SELF PAY ONLY) Addendum Date: 12/23/2023 ADDENDUM REPORT: 12/23/2023 16:12 EXAM: OVER-READ INTERPRETATION CT CHEST The following report is an over-read performed by radiologist Dr. Norman Hopper of [facility] on 12/23/2023. This over-read does not include interpretation of cardiac or coronary anatomy or pathology. The coronary calcium  score interpretation by the cardiologist is attached. COMPARISON:  None. FINDINGS: Cardiovascular: No significant extracardiac vascular findings. Normal heart size. No pericardial effusion. Mediastinum/Nodes: No enlarged mediastinal lymph nodes. The visible trachea and esophagus demonstrate no  significant findings. Lungs/Pleura: The visible lungs are clear. No pleural effusion. Upper Abdomen: No acute abnormality. Musculoskeletal: No chest wall mass or suspicious bone lesions identified. IMPRESSION: No significant extracardiac findings. Electronically Signed   By: Norman Hopper M.D.   On: 12/23/2023 16:12   Result Date: 12/23/2023 CLINICAL DATA:  Cardiovascular Disease Risk stratification EXAM: Coronary Calcium  Score TECHNIQUE: A gated, non-contrast computed tomography scan of the heart was performed using 3mm slice thickness. Axial images were analyzed on a dedicated workstation. Calcium  scoring of the coronary arteries was performed using the Agatston method. FINDINGS: Coronary Calcium  Score: Left main: 0 Left anterior descending artery: 15.6 Left circumflex artery: 0 Right coronary artery: 0 Total: 15.6 Percentile: 63rd Pericardium: Normal. Non-cardiac: See separate report from [facility]. IMPRESSION: 1. Coronary calcium  score of 15.6. This was 63rd percentile for age-, race-, and sex-matched controls. RECOMMENDATIONS: Coronary artery calcium  (CAC) score is a strong predictor of incident coronary heart disease (CHD) and provides predictive information beyond traditional risk factors. CAC scoring is reasonable to use in the decision to withhold, postpone, or initiate statin therapy in intermediate-risk or selected borderline-risk asymptomatic adults (age 65-75 years and LDL-C >=70 to <190 mg/dL) who do not have diabetes or established atherosclerotic cardiovascular disease (ASCVD).* In intermediate-risk (10-year ASCVD risk >=7.5% to <20%) adults or selected borderline-risk (10-year ASCVD risk >=5% to <7.5%) adults in whom a CAC score is measured for the purpose of making a treatment decision the following recommendations have been made: If CAC=0, it is reasonable to withhold statin therapy and reassess in 5 to 10 years, as long as higher risk conditions are absent (diabetes mellitus, family  history of premature CHD in first degree relatives (males <55 years; females <65 years), cigarette smoking, or LDL >=190 mg/dL). If CAC is 1 to 99, it is reasonable to initiate statin  therapy for patients >=70 years of age. If CAC is >=100 or >=75th percentile, it is reasonable to initiate statin therapy at any age. Cardiology referral should be considered for patients with CAC scores >=400 or >=75th percentile. *2018 AHA/ACC/AACVPR/AAPA/ABC/ACPM/ADA/AGS/APhA/ASPC/NLA/PCNA Guideline on the Management of Blood Cholesterol: A Report of the American College of Cardiology/American Heart Association Task Force on Clinical Practice Guidelines. J Am Coll Cardiol. 2019;73(24):3168-3209. Darryle Decent, MD Electronically Signed: By: Darryle Decent M.D. On: 12/17/2023 17:57       Assessment & Plan:  Hypercholesterolemia Assessment & Plan: Continue crestor . Low cholesterol diet and exercise. Follow lipid panel.   Orders: -     Hepatic function panel; Future -     Lipid panel; Future -     TSH; Future  Type 2 diabetes mellitus with hyperglycemia, without long-term current use of insulin (HCC) Assessment & Plan: Continue low carb diet and exercise.  Continue jardiance , tresiba  and ozmpic.  Sugars as outlined. Follow met b and A1c. Check today.   Orders: -     Basic metabolic panel with GFR; Future -     Hemoglobin A1c; Future -     Microalbumin / creatinine urine ratio; Future  Prostate cancer screening -     PSA; Future  Thrombocytopenia Assessment & Plan: Last check wnl. Follow cbc.    Stress Assessment & Plan: Increased stress as outlined. Discussed. Will notify me if feels needs further intervention. Follow.    Colon cancer screening Assessment & Plan: Notify - when agreeable for colonoscopy.       Allena Hamilton, MD

## 2024-08-30 ENCOUNTER — Other Ambulatory Visit (HOSPITAL_COMMUNITY): Payer: Self-pay

## 2024-08-30 NOTE — Telephone Encounter (Signed)
 Noted

## 2024-08-30 NOTE — Telephone Encounter (Signed)
 PA has been approved in separate encounter, thank you.

## 2024-08-30 NOTE — Telephone Encounter (Signed)
 Pharmacy Patient Advocate Encounter  Received notification from [facility] that Prior Authorization for Jardiance  25MG  tablets  has been APPROVED from 08/29/24 to 08/30/27. Ran test claim, Copay is $35. This test claim was processed through [facility]- copay amounts may vary at other pharmacies due to pharmacy/plan contracts, or as the patient moves through the different stages of their insurance plan.   PA #/Case ID/Reference #: [id number]

## 2024-09-04 ENCOUNTER — Ambulatory Visit: Admitting: Internal Medicine

## 2024-09-04 NOTE — Assessment & Plan Note (Signed)
Last check wnl.  Follow cbc.

## 2024-09-04 NOTE — Assessment & Plan Note (Signed)
 Continue low carb diet and exercise.  Continue jardiance , tresiba  and ozmpic.  Sugars as outlined. Follow met b and A1c. Check today.

## 2024-09-04 NOTE — Assessment & Plan Note (Signed)
 Notify - when agreeable for colonoscopy.

## 2024-09-04 NOTE — Assessment & Plan Note (Signed)
Continue crestor.  Low cholesterol diet and exercise.  Follow lipid panel.  

## 2024-09-04 NOTE — Assessment & Plan Note (Signed)
 Increased stress as outlined. Discussed. Will notify me if feels needs further intervention. Follow.

## 2024-10-03 ENCOUNTER — Other Ambulatory Visit: Payer: Self-pay | Admitting: Internal Medicine

## 2024-10-16 ENCOUNTER — Other Ambulatory Visit: Payer: Self-pay | Admitting: Internal Medicine

## 2024-10-30 ENCOUNTER — Other Ambulatory Visit: Payer: Self-pay | Admitting: Internal Medicine

## 2024-10-30 ENCOUNTER — Other Ambulatory Visit: Payer: Self-pay | Admitting: *Deleted

## 2024-10-30 DIAGNOSIS — E7800 Pure hypercholesterolemia, unspecified: Secondary | ICD-10-CM

## 2024-10-30 MED ORDER — OZEMPIC (0.25 OR 0.5 MG/DOSE) 2 MG/3ML ~~LOC~~ SOPN: 0.5000 mg | PEN_INJECTOR | SUBCUTANEOUS | 3 refills | Status: AC

## 2024-10-30 MED ORDER — EMPAGLIFLOZIN 25 MG PO TABS: 25.0000 mg | ORAL_TABLET | Freq: Every day | ORAL | 3 refills | Status: AC

## 2024-10-30 MED ORDER — SERTRALINE HCL 25 MG PO TABS: 25.0000 mg | ORAL_TABLET | Freq: Every day | ORAL | 1 refills | Status: AC

## 2024-10-30 MED ORDER — ROSUVASTATIN CALCIUM 40 MG PO TABS: 40.0000 mg | ORAL_TABLET | Freq: Every day | ORAL | 1 refills | Status: AC

## 2024-10-30 MED ORDER — SERTRALINE HCL 25 MG PO TABS: 25.0000 mg | ORAL_TABLET | Freq: Every day | ORAL | 3 refills | Status: DC

## 2024-11-06 ENCOUNTER — Other Ambulatory Visit (HOSPITAL_COMMUNITY): Payer: Self-pay

## 2024-11-06 ENCOUNTER — Telehealth: Payer: Self-pay

## 2024-11-06 ENCOUNTER — Other Ambulatory Visit: Payer: Self-pay | Admitting: Internal Medicine

## 2024-11-06 DIAGNOSIS — E7800 Pure hypercholesterolemia, unspecified: Secondary | ICD-10-CM

## 2024-11-06 NOTE — Telephone Encounter (Signed)
 Pharmacy Patient Advocate Encounter   Received notification from Physician's Office that prior authorization for Ozempic  (0.25 or 0.5 MG/DOSE) 2MG /3ML pen-injectors is required/requested.   Insurance verification completed.   The patient is insured through [facility].   Per test claim: PA required; PA submitted to above mentioned insurance via Latent Key/confirmation #/EOC [id number] Status is pending

## 2024-11-08 ENCOUNTER — Other Ambulatory Visit (HOSPITAL_COMMUNITY): Payer: Self-pay

## 2024-11-17 ENCOUNTER — Other Ambulatory Visit (HOSPITAL_COMMUNITY): Payer: Self-pay

## 2024-11-17 NOTE — Telephone Encounter (Signed)
 Prior auth approved for 07/31/2024 to 07/31/2025

## 2025-01-01 ENCOUNTER — Ambulatory Visit: Admitting: Internal Medicine
